# Patient Record
Sex: Female | Born: 1970 | Race: White | Hispanic: No | Marital: Married | Smoking: Former smoker
Health system: Southern US, Community
[De-identification: ages and names within clinical notes are randomized; demographics above are authoritative.]

## PROBLEM LIST (undated history)

## (undated) HISTORY — PX: CHOLECYSTECTOMY: SHX55

---

## 2009-11-10 ENCOUNTER — Ambulatory Visit (HOSPITAL_COMMUNITY): Admission: RE | Admit: 2009-11-10 | Discharge: 2009-11-10 | Payer: Self-pay | Admitting: Family Medicine

## 2012-01-08 ENCOUNTER — Other Ambulatory Visit (HOSPITAL_COMMUNITY): Payer: Self-pay | Admitting: Family Medicine

## 2012-01-08 ENCOUNTER — Ambulatory Visit (HOSPITAL_COMMUNITY)
Admission: RE | Admit: 2012-01-08 | Discharge: 2012-01-08 | Disposition: A | Discharge status: Home / Self Care | Payer: 59 | Source: Ambulatory Visit | Attending: Family Medicine | Admitting: Family Medicine

## 2012-01-08 DIAGNOSIS — R52 Pain, unspecified: Secondary | ICD-10-CM

## 2012-01-08 DIAGNOSIS — F17200 Nicotine dependence, unspecified, uncomplicated: Secondary | ICD-10-CM | POA: Insufficient documentation

## 2012-01-08 DIAGNOSIS — R079 Chest pain, unspecified: Secondary | ICD-10-CM | POA: Insufficient documentation

## 2012-01-23 ENCOUNTER — Ambulatory Visit (HOSPITAL_COMMUNITY)
Admission: RE | Admit: 2012-01-23 | Discharge: 2012-01-23 | Disposition: A | Discharge status: Home / Self Care | Payer: 59 | Source: Ambulatory Visit | Attending: Family Medicine | Admitting: Family Medicine

## 2012-01-23 DIAGNOSIS — E049 Nontoxic goiter, unspecified: Secondary | ICD-10-CM | POA: Insufficient documentation

## 2012-01-23 DIAGNOSIS — R52 Pain, unspecified: Secondary | ICD-10-CM

## 2012-03-04 ENCOUNTER — Other Ambulatory Visit (HOSPITAL_COMMUNITY): Payer: Self-pay | Admitting: Endocrinology

## 2012-03-04 DIAGNOSIS — E041 Nontoxic single thyroid nodule: Secondary | ICD-10-CM

## 2012-03-10 ENCOUNTER — Ambulatory Visit (HOSPITAL_COMMUNITY): Payer: 59

## 2012-03-17 ENCOUNTER — Other Ambulatory Visit (HOSPITAL_COMMUNITY): Payer: Self-pay | Admitting: Endocrinology

## 2012-03-17 ENCOUNTER — Ambulatory Visit (HOSPITAL_COMMUNITY)
Admission: RE | Admit: 2012-03-17 | Discharge: 2012-03-17 | Disposition: A | Discharge status: Home / Self Care | Payer: 59 | Source: Ambulatory Visit | Attending: Endocrinology | Admitting: Endocrinology

## 2012-03-17 VITALS — BP 109/73 | Resp 18

## 2012-03-17 DIAGNOSIS — E041 Nontoxic single thyroid nodule: Secondary | ICD-10-CM

## 2012-03-17 DIAGNOSIS — E042 Nontoxic multinodular goiter: Secondary | ICD-10-CM | POA: Insufficient documentation

## 2012-03-17 NOTE — Procedures (Signed)
PreOperative Dx: Multiple thyroid nodules Postoperative Dx: Multiple thyroid nodules Procedure:   US guided FNA of BILATERAL thyroid nodules Radiologist:  Tyron Russell Anesthesia:  1ml of 2% lidocaine Specimen:  FNA x 3 RIGHT nodule, FNA x 3 LEFT thyroid nodule EBL:   None Complications: None

## 2012-03-17 NOTE — Progress Notes (Signed)
Lidocaine 2%        1mL injected 

## 2012-03-27 ENCOUNTER — Other Ambulatory Visit (HOSPITAL_COMMUNITY): Payer: Self-pay | Admitting: Endocrinology

## 2012-03-27 DIAGNOSIS — E041 Nontoxic single thyroid nodule: Secondary | ICD-10-CM

## 2012-08-03 ENCOUNTER — Ambulatory Visit (HOSPITAL_COMMUNITY): Payer: 59

## 2012-09-03 ENCOUNTER — Other Ambulatory Visit (HOSPITAL_COMMUNITY): Payer: Self-pay | Admitting: Endocrinology

## 2012-09-03 DIAGNOSIS — E049 Nontoxic goiter, unspecified: Secondary | ICD-10-CM

## 2012-09-08 ENCOUNTER — Ambulatory Visit (HOSPITAL_COMMUNITY): Payer: 59

## 2012-09-10 ENCOUNTER — Ambulatory Visit (HOSPITAL_COMMUNITY)
Admission: RE | Admit: 2012-09-10 | Discharge: 2012-09-10 | Disposition: A | Discharge status: Home / Self Care | Payer: 59 | Source: Ambulatory Visit | Attending: Endocrinology | Admitting: Endocrinology

## 2012-09-10 DIAGNOSIS — E041 Nontoxic single thyroid nodule: Secondary | ICD-10-CM | POA: Insufficient documentation

## 2012-09-10 DIAGNOSIS — E049 Nontoxic goiter, unspecified: Secondary | ICD-10-CM

## 2013-06-15 ENCOUNTER — Other Ambulatory Visit (HOSPITAL_COMMUNITY): Payer: Self-pay | Admitting: Endocrinology

## 2013-06-15 DIAGNOSIS — E049 Nontoxic goiter, unspecified: Secondary | ICD-10-CM

## 2013-10-04 ENCOUNTER — Ambulatory Visit (HOSPITAL_COMMUNITY): Payer: 59

## 2013-10-08 ENCOUNTER — Ambulatory Visit (HOSPITAL_COMMUNITY)
Admission: RE | Admit: 2013-10-08 | Discharge: 2013-10-08 | Disposition: A | Discharge status: Home / Self Care | Payer: 59 | Source: Ambulatory Visit | Attending: Endocrinology | Admitting: Endocrinology

## 2013-10-08 DIAGNOSIS — E042 Nontoxic multinodular goiter: Secondary | ICD-10-CM | POA: Insufficient documentation

## 2013-10-08 DIAGNOSIS — E049 Nontoxic goiter, unspecified: Secondary | ICD-10-CM

## 2015-07-06 ENCOUNTER — Other Ambulatory Visit (HOSPITAL_COMMUNITY): Payer: Self-pay | Admitting: Family Medicine

## 2015-07-06 DIAGNOSIS — E049 Nontoxic goiter, unspecified: Secondary | ICD-10-CM

## 2015-07-12 ENCOUNTER — Ambulatory Visit (HOSPITAL_COMMUNITY)
Admission: RE | Admit: 2015-07-12 | Discharge: 2015-07-12 | Disposition: A | Discharge status: Home / Self Care | Payer: BLUE CROSS/BLUE SHIELD | Source: Ambulatory Visit | Attending: Family Medicine | Admitting: Family Medicine

## 2015-07-12 DIAGNOSIS — E049 Nontoxic goiter, unspecified: Secondary | ICD-10-CM

## 2015-07-12 DIAGNOSIS — E042 Nontoxic multinodular goiter: Secondary | ICD-10-CM | POA: Insufficient documentation

## 2015-10-11 ENCOUNTER — Encounter (HOSPITAL_COMMUNITY): Payer: Self-pay | Admitting: *Deleted

## 2015-10-11 ENCOUNTER — Emergency Department (HOSPITAL_COMMUNITY)
Admission: EM | Admit: 2015-10-11 | Discharge: 2015-10-12 | Disposition: A | Discharge status: Home / Self Care | Payer: BLUE CROSS/BLUE SHIELD | Attending: Emergency Medicine | Admitting: Emergency Medicine

## 2015-10-11 ENCOUNTER — Emergency Department (HOSPITAL_COMMUNITY): Payer: BLUE CROSS/BLUE SHIELD

## 2015-10-11 DIAGNOSIS — J9801 Acute bronchospasm: Secondary | ICD-10-CM | POA: Insufficient documentation

## 2015-10-11 DIAGNOSIS — R600 Localized edema: Secondary | ICD-10-CM | POA: Insufficient documentation

## 2015-10-11 DIAGNOSIS — R079 Chest pain, unspecified: Secondary | ICD-10-CM | POA: Diagnosis present

## 2015-10-11 DIAGNOSIS — Z87891 Personal history of nicotine dependence: Secondary | ICD-10-CM | POA: Diagnosis not present

## 2015-10-11 LAB — BASIC METABOLIC PANEL
ANION GAP: 8 (ref 5–15)
BUN: 14 mg/dL (ref 6–20)
CALCIUM: 8.9 mg/dL (ref 8.9–10.3)
CO2: 23 mmol/L (ref 22–32)
CREATININE: 0.96 mg/dL (ref 0.44–1.00)
Chloride: 106 mmol/L (ref 101–111)
Glucose, Bld: 113 mg/dL — ABNORMAL HIGH (ref 65–99)
Potassium: 3.7 mmol/L (ref 3.5–5.1)
SODIUM: 137 mmol/L (ref 135–145)

## 2015-10-11 LAB — CBC
HCT: 39.1 % (ref 36.0–46.0)
Hemoglobin: 13 g/dL (ref 12.0–15.0)
MCH: 28.4 pg (ref 26.0–34.0)
MCHC: 33.2 g/dL (ref 30.0–36.0)
MCV: 85.6 fL (ref 78.0–100.0)
PLATELETS: 356 10*3/uL (ref 150–400)
RBC: 4.57 MIL/uL (ref 3.87–5.11)
RDW: 13.2 % (ref 11.5–15.5)
WBC: 10.7 10*3/uL — AB (ref 4.0–10.5)

## 2015-10-11 LAB — TROPONIN I

## 2015-10-11 NOTE — ED Notes (Signed)
Pt c/o mid center chest pain that radiates to left side of chest area that started last night, pain is associated being lightheaded,

## 2015-10-12 LAB — TROPONIN I: Troponin I: <0.03 ng/mL (ref <0.031)

## 2015-10-12 LAB — D-DIMER, QUANTITATIVE: D-Dimer, Quant: 0.3 ug/mL-FEU (ref 0.00–0.50)

## 2015-10-12 MED ORDER — ALBUTEROL SULFATE HFA 108 (90 BASE) MCG/ACT IN AERS: 2.0000 | INHALATION_SPRAY | RESPIRATORY_TRACT | Status: AC | PRN

## 2015-10-12 MED ORDER — IPRATROPIUM-ALBUTEROL 0.5-2.5 (3) MG/3ML IN SOLN
3.0000 mL | Freq: Once | RESPIRATORY_TRACT | Status: AC
  Administered 2015-10-12: 3 mL via RESPIRATORY_TRACT
  Filled 2015-10-12: qty 3

## 2015-10-12 MED ORDER — PREDNISONE 50 MG PO TABS: 50.0000 mg | ORAL_TABLET | Freq: Every day | ORAL | Status: AC

## 2015-10-12 MED ORDER — PREDNISONE 50 MG PO TABS
60.0000 mg | ORAL_TABLET | Freq: Once | ORAL | Status: AC
  Administered 2015-10-12: 60 mg via ORAL
  Filled 2015-10-12: qty 1

## 2015-10-12 NOTE — ED Provider Notes (Signed)
CSN: [id number]     Arrival date & time 10/11/15  2251 History   First MD Initiated Contact with Patient 10/12/15 0005     Chief Complaint  Patient presents with  . Chest Pain     (Consider location/radiation/quality/duration/timing/severity/associated sxs/prior Treatment) Patient is a 45 y.o. female presenting with chest pain. The history is provided by the patient.  Chest Pain She has had a tight feeling in her chest since last night. There is associated dyspnea. She denies cough, fever, nausea, diaphoresis. Symptoms are slightly worse with exertion. She did take 2 aspirin at home and made an appointment with her PCP but got concerned and decided to come to the ED for evaluation. She's not had symptoms like this before. She is a nonsmoker without history of diabetes, hypertension, hyperlipidemia, and without family history of premature coronary atherosclerosis. She denies any recent surgery or long distance travel and is not on oral contraceptives.  History reviewed. No pertinent past medical history. Past Surgical History  Procedure Laterality Date  . Cholecystectomy     No family history on file. Social History  Substance Use Topics  . Smoking status: Former Games developer  . Smokeless tobacco: None  . Alcohol Use: No   OB History    No data available     Review of Systems  Cardiovascular: Positive for chest pain.  All other systems reviewed and are negative.     Allergies  Review of patient's allergies indicates no known allergies.  Home Medications   Prior to Admission medications   Not on File   BP 137/78 mmHg  Pulse 79  Temp(Src) 98.4 F (36.9 C) (Oral)  Resp 15  Ht 5' (1.524 m)  Wt 226 lb (102.513 kg)  BMI 44.14 kg/m2  SpO2 95%  LMP 09/24/2015 Physical Exam  Nursing note and vitals reviewed.  45 year old female, resting comfortably and in no acute distress. Vital signs are normal. Oxygen saturation is 95%, which is normal. Head is normocephalic and  atraumatic. PERRLA, EOMI. Oropharynx is clear. Neck is nontender and supple without adenopathy or JVD. Back is nontender and there is no CVA tenderness. Lungs are clear without rales, wheezes, or rhonchi. Slightly prolonged exhalation phase is noted. Chest is nontender. Heart has regular rate and rhythm without murmur. Abdomen is soft, flat, nontender without masses or hepatosplenomegaly and peristalsis is normoactive. Extremities have 1+ edema, full range of motion is present. Skin is warm and dry without rash. Neurologic: Mental status is normal, cranial nerves are intact, there are no motor or sensory deficits.  ED Course  Procedures (including critical care time) Labs Review Results for orders placed or performed during the hospital encounter of 10/11/15  Basic metabolic panel  Result Value Ref Range   Sodium 137 135 - 145 mmol/L   Potassium 3.7 3.5 - 5.1 mmol/L   Chloride 106 101 - 111 mmol/L   CO2 23 22 - 32 mmol/L   Glucose, Bld 113 (H) 65 - 99 mg/dL   BUN 14 6 - 20 mg/dL   Creatinine, Ser 8.75 0.44 - 1.00 mg/dL   Calcium 8.9 8.9 - 64.3 mg/dL   GFR calc non Af Amer >60 >60 mL/min   GFR calc Af Amer >60 >60 mL/min   Anion gap 8 5 - 15  CBC  Result Value Ref Range   WBC 10.7 (H) 4.0 - 10.5 K/uL   RBC 4.57 3.87 - 5.11 MIL/uL   Hemoglobin 13.0 12.0 - 15.0 g/dL   HCT 39.1  36.0 - 46.0 %   MCV 85.6 78.0 - 100.0 fL   MCH 28.4 26.0 - 34.0 pg   MCHC 33.2 30.0 - 36.0 g/dL   RDW 86.513.2 78.411.5 - 69.615.5 %   Platelets 356 150 - 400 K/uL  Troponin I  Result Value Ref Range   Troponin I <0.03 <0.031 ng/mL  D-dimer, quantitative  Result Value Ref Range   D-Dimer, Quant 0.30 0.00 - 0.50 ug/mL-FEU  Troponin I  Result Value Ref Range   Troponin I <0.03 <0.031 ng/mL   Imaging Review Dg Chest 2 View  10/12/2015  CLINICAL DATA:  Central chest pain radiating to the left chest, onset last night. EXAM: CHEST  2 VIEW COMPARISON:  01/08/2012 FINDINGS: The cardiomediastinal contours are normal.  Minimal right infrahilar subsegmental atelectasis. Pulmonary vasculature is normal. No consolidation, pleural effusion, or pneumothorax. No acute osseous abnormalities are seen. IMPRESSION: Minimal right infrahilar subsegmental atelectasis. Otherwise clear lungs. Electronically Signed   By: Rubye OaksMelanie  Ehinger M.D.   On: 10/12/2015 00:13   I have personally reviewed and evaluated these images and lab results as part of my medical decision-making.   EKG Interpretation   Date/Time:  Wednesday October 11 2015 23:00:59 EST Ventricular Rate:  81 PR Interval:  139 QRS Duration: 92 QT Interval:  398 QTC Calculation: 462 R Axis:   41 Text Interpretation:  Sinus rhythm Abnormal R-wave progression, early  transition No old tracing to compare Confirmed by [facility]GL[facility]  MD, Eleanora Guinyard ([id number])  on 10/11/2015 11:03:12 PM      MDM   Final diagnoses:  Bronchospasm    Chest discomfort of uncertain cause. With prolonged exhalation phase, I wonder if she may have some low-grade bronchospasm and will be given therapeutic trial of albuterol with ipratropium. With no other obvious cause, will also get d-dimer to screen for pulmonary embolism even though she does not have any risk factors for pulmonary embolism. Old records are reviewed and she has no relevant past visits.  D-dimer is come back negative. She had excellent relief of symptoms with albuterol and ipratropium. Apparently, all of her symptoms are related to bronchospasm. Repeat troponin was obtained and was also normal. She is discharged with prescriptions for prednisone and albuterol inhaler.  Dione Boozeavid Essa Malachi, MD 10/12/15 [phone]

## 2015-10-12 NOTE — ED Notes (Signed)
resp paged for breathing tx,  

## 2015-10-12 NOTE — Discharge Instructions (Signed)
Bronchospasm, Adult  A bronchospasm is a spasm or tightening of the airways going into the lungs. During a bronchospasm breathing becomes more difficult because the airways get smaller. When this happens there can be coughing, a whistling sound when breathing (wheezing), and difficulty breathing. Bronchospasm is often associated with asthma, but not all patients who experience a bronchospasm have asthma.  CAUSES   A bronchospasm is caused by inflammation or irritation of the airways. The inflammation or irritation may be triggered by:   · Allergies (such as to animals, pollen, food, or mold). Allergens that cause bronchospasm may cause wheezing immediately after exposure or many hours later.    · Infection. Viral infections are believed to be the most common cause of bronchospasm.    · Exercise.    · Irritants (such as pollution, cigarette smoke, strong odors, aerosol sprays, and paint fumes).    · Weather changes. Winds increase molds and pollens in the air. Rain refreshes the air by washing irritants out. Cold air may cause inflammation.    · Stress and emotional upset.    SIGNS AND SYMPTOMS   · Wheezing.    · Excessive nighttime coughing.    · Frequent or severe coughing with a simple cold.    · Chest tightness.    · Shortness of breath.    DIAGNOSIS   Bronchospasm is usually diagnosed through a history and physical exam. Tests, such as chest X-rays, are sometimes done to look for other conditions.  TREATMENT   · Inhaled medicines can be given to open up your airways and help you breathe. The medicines can be given using either an inhaler or a nebulizer machine.  · Corticosteroid medicines may be given for severe bronchospasm, usually when it is associated with asthma.  HOME CARE INSTRUCTIONS   · Always have a plan prepared for seeking medical care. Know when to call your health care provider and local emergency services (911 in the [place] Know where you can access local emergency care.  · Only take medicines as  directed by your health care provider.  · If you were prescribed an inhaler or nebulizer machine, ask your health care provider to explain how to use it correctly. Always use a spacer with your inhaler if you were given one.  · It is necessary to remain calm during an attack. Try to relax and breathe more slowly.   · Control your home environment in the following ways:      Change your heating and air conditioning filter at least once a month.      Limit your use of fireplaces and wood stoves.    Do not smoke and do not allow smoking in your home.      Avoid exposure to perfumes and fragrances.      Get rid of pests (such as roaches and mice) and their droppings.      Throw away plants if you see mold on them.      Keep your house clean and dust free.      Replace carpet with wood, tile, or vinyl flooring. Carpet can trap dander and dust.      Use allergy-proof pillows, mattress covers, and box spring covers.      Wash bed sheets and blankets every week in hot water and dry them in a dryer.      Use blankets that are made of polyester or cotton.      Wash hands frequently.  SEEK MEDICAL CARE IF:   · You have muscle aches.    · You have chest pain.    · The sputum changes from clear or   even after taking your prescribed medicines.   You have increased difficulty breathing.   You develop severe chest pain. MAKE SURE YOU:   Understand these instructions.  Will watch your condition.  Will get help right away if you are not doing well or get worse.   This information is not intended to replace advice given to you by your health care provider. Make sure you discuss any questions you have with your health care  provider.   Document Released: 09/19/2003 Document Revised: 10/07/2014 Document Reviewed: 03/08/2013 Elsevier Interactive Patient Education 2016 Else[employer] Inc.  Albuterol inhalation aerosol What is this medicine? ALBUTEROL (al Gaspar Bidding) is a bronchodilator. It helps open up the airways in your lungs to make it easier to breathe. This medicine is used to treat and to prevent bronchospasm. This medicine may be used for other purposes; ask your health care provider or pharmacist if you have questions. What should I tell my health care provider before I take this medicine? They need to know if you have any of the following conditions: -diabetes -heart disease or irregular heartbeat -high blood pressure -pheochromocytoma -seizures -thyroid disease -an unusual or allergic reaction to albuterol, levalbuterol, sulfites, other medicines, foods, dyes, or preservatives -pregnant or trying to get pregnant -breast-feeding How should I use this medicine? This medicine is for inhalation through the mouth. Follow the directions on your prescription label. Take your medicine at regular intervals. Do not use more often than directed. Make sure that you are using your inhaler correctly. Ask you doctor or health care provider if you have any questions. Talk to your pediatrician regarding the use of this medicine in children. Special care may be needed. Overdosage: If you think you have taken too much of this medicine contact a poison control center or emergency room at once. NOTE: This medicine is only for you. Do not share this medicine with others. What if I miss a dose? If you miss a dose, use it as soon as you can. If it is almost time for your next dose, use only that dose. Do not use double or extra doses. What may interact with this medicine? -anti-infectives like chloroquine and pentamidine -caffeine -cisapride -diuretics -medicines for colds -medicines for depression or for emotional or  psychotic conditions -medicines for weight loss including some herbal products -methadone -some antibiotics like clarithromycin, erythromycin, levofloxacin, and linezolid -some heart medicines -steroid hormones like dexamethasone, cortisone, hydrocortisone -theophylline -thyroid hormones This list may not describe all possible interactions. Give your health care provider a list of all the medicines, herbs, non-prescription drugs, or dietary supplements you use. Also tell them if you smoke, drink alcohol, or use illegal drugs. Some items may interact with your medicine. What should I watch for while using this medicine? Tell your doctor or health care professional if your symptoms do not improve. Do not use extra albuterol. If your asthma or bronchitis gets worse while you are using this medicine, call your doctor right away. If your mouth gets dry try chewing sugarless gum or sucking hard candy. Drink water as directed. What side effects may I notice from receiving this medicine? Side effects that you should report to your doctor or health care professional as soon as possible: -allergic reactions like skin rash, itching or hives, swelling of the face, lips, or tongue -breathing problems -chest pain -feeling faint or lightheaded, falls -high blood pressure -irregular heartbeat -fever -muscle cramps or weakness -pain, tingling, numbness in the hands or feet -vomiting Side effects  that usually do not require medical attention (report to your doctor or health care professional if they continue or are bothersome): -cough -difficulty sleeping -headache -nervousness or trembling -stomach upset -stuffy or runny nose -throat irritation -unusual taste This list may not describe all possible side effects. Call your doctor for medical advice about side effects. You may report side effects to FDA at 1-800-FDA-1088. Where should I keep my medicine? Keep out of the reach of children. Store at  room temperature between 15 and 30 degrees C (59 and 86 degrees F). The contents are under pressure and may burst when exposed to heat or flame. Do not freeze. This medicine does not work as well if it is too cold. Throw away any unused medicine after the expiration date. Inhalers need to be thrown away after the labeled number of puffs have been used or by the expiration date; whichever comes first. Ventolin HFA should be thrown away 12 months after removing from foil pouch. Check the instructions that come with your medicine. NOTE: This sheet is a summary. It may not cover all possible information. If you have questions about this medicine, talk to your doctor, pharmacist, or health care provider.    2016, Elsevier/Gold Standard. (2013-03-04 10:57:17)  Prednisone tablets What is this medicine? PREDNISONE (PRED ni sone) is a corticosteroid. It is commonly used to treat inflammation of the skin, joints, lungs, and other organs. Common conditions treated include asthma, allergies, and arthritis. It is also used for other conditions, such as blood disorders and diseases of the adrenal glands. This medicine may be used for other purposes; ask your health care provider or pharmacist if you have questions. What should I tell my health care provider before I take this medicine? They need to know if you have any of these conditions: -Cushing's syndrome -diabetes -glaucoma -heart disease -high blood pressure -infection (especially a virus infection such as chickenpox, cold sores, or herpes) -kidney disease -liver disease -mental illness -myasthenia gravis -osteoporosis -seizures -stomach or intestine problems -thyroid disease -an unusual or allergic reaction to lactose, prednisone, other medicines, foods, dyes, or preservatives -pregnant or trying to get pregnant -breast-feeding How should I use this medicine? Take this medicine by mouth with a glass of water. Follow the directions on the  prescription label. Take this medicine with food. If you are taking this medicine once a day, take it in the morning. Do not take more medicine than you are told to take. Do not suddenly stop taking your medicine because you may develop a severe reaction. Your doctor will tell you how much medicine to take. If your doctor wants you to stop the medicine, the dose may be slowly lowered over time to avoid any side effects. Talk to your pediatrician regarding the use of this medicine in children. Special care may be needed. Overdosage: If you think you have taken too much of this medicine contact a poison control center or emergency room at once. NOTE: This medicine is only for you. Do not share this medicine with others. What if I miss a dose? If you miss a dose, take it as soon as you can. If it is almost time for your next dose, talk to your doctor or health care professional. You may need to miss a dose or take an extra dose. Do not take double or extra doses without advice. What may interact with this medicine? Do not take this medicine with any of the following medications: -metyrapone -mifepristone This medicine may also  interact with the following medications: -aminoglutethimide -amphotericin B -aspirin and aspirin-like medicines -barbiturates -certain medicines for diabetes, like glipizide or glyburide -cholestyramine -cholinesterase inhibitors -cyclosporine -digoxin -diuretics -ephedrine -female hormones, like estrogens and birth control pills -isoniazid -ketoconazole -NSAIDS, medicines for pain and inflammation, like ibuprofen or naproxen -phenytoin -rifampin -toxoids -vaccines -warfarin This list may not describe all possible interactions. Give your health care provider a list of all the medicines, herbs, non-prescription drugs, or dietary supplements you use. Also tell them if you smoke, drink alcohol, or use illegal drugs. Some items may interact with your medicine. What  should I watch for while using this medicine? Visit your doctor or health care professional for regular checks on your progress. If you are taking this medicine over a prolonged period, carry an identification card with your name and address, the type and dose of your medicine, and your doctor's name and address. This medicine may increase your risk of getting an infection. Tell your doctor or health care professional if you are around anyone with measles or chickenpox, or if you develop sores or blisters that do not heal properly. If you are going to have surgery, tell your doctor or health care professional that you have taken this medicine within the last twelve months. Ask your doctor or health care professional about your diet. You may need to lower the amount of salt you eat. This medicine may affect blood sugar levels. If you have diabetes, check with your doctor or health care professional before you change your diet or the dose of your diabetic medicine. What side effects may I notice from receiving this medicine? Side effects that you should report to your doctor or health care professional as soon as possible: -allergic reactions like skin rash, itching or hives, swelling of the face, lips, or tongue -changes in emotions or moods -changes in vision -depressed mood -eye pain -fever or chills, cough, sore throat, pain or difficulty passing urine -increased thirst -swelling of ankles, feet Side effects that usually do not require medical attention (report to your doctor or health care professional if they continue or are bothersome): -confusion, excitement, restlessness -headache -nausea, vomiting -skin problems, acne, thin and shiny skin -trouble sleeping -weight gain This list may not describe all possible side effects. Call your doctor for medical advice about side effects. You may report side effects to FDA at 1-800-FDA-1088. Where should I keep my medicine? Keep out of the reach  of children. Store at room temperature between 15 and 30 degrees C (59 and 86 degrees F). Protect from light. Keep container tightly closed. Throw away any unused medicine after the expiration date. NOTE: This sheet is a summary. It may not cover all possible information. If you have questions about this medicine, talk to your doctor, pharmacist, or health care provider.    2016, Elsevier/Gold Standard. (2011-05-02 10:57:14)

## 2016-07-30 ENCOUNTER — Other Ambulatory Visit (HOSPITAL_COMMUNITY): Payer: Self-pay | Admitting: Family Medicine

## 2016-07-30 DIAGNOSIS — E042 Nontoxic multinodular goiter: Secondary | ICD-10-CM

## 2016-08-02 ENCOUNTER — Ambulatory Visit (HOSPITAL_COMMUNITY)
Admission: RE | Admit: 2016-08-02 | Discharge: 2016-08-02 | Disposition: A | Discharge status: Home / Self Care | Payer: BLUE CROSS/BLUE SHIELD | Source: Ambulatory Visit | Attending: Family Medicine | Admitting: Family Medicine

## 2016-08-02 DIAGNOSIS — E042 Nontoxic multinodular goiter: Secondary | ICD-10-CM

## 2017-05-14 IMAGING — US US THYROID
1 of 2 series · 12 of 25 positions shown · non-contrast
Comparison: 07/12/2015

CLINICAL DATA: Multi nodular goiter follow-up.

EXAM:
THYROID ULTRASOUND
TECHNIQUE: Ultrasound examination of the thyroid gland and adjacent soft
tissues was performed.

[Series 1: us thyroid · 0.06mm/px · 12 of 65 slices shown]
[im 3/65]
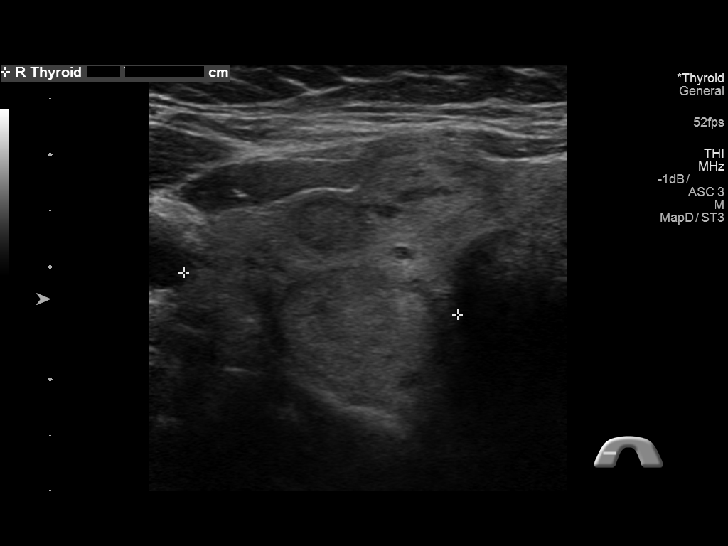
[im 9/65]
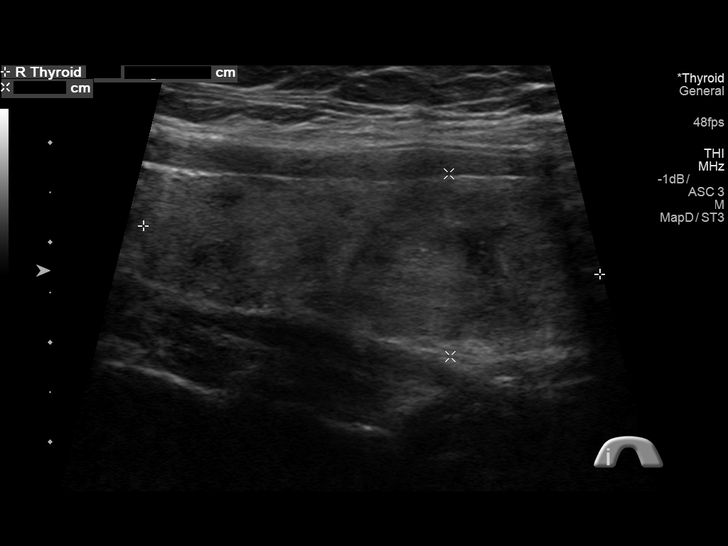
[im 14/65]
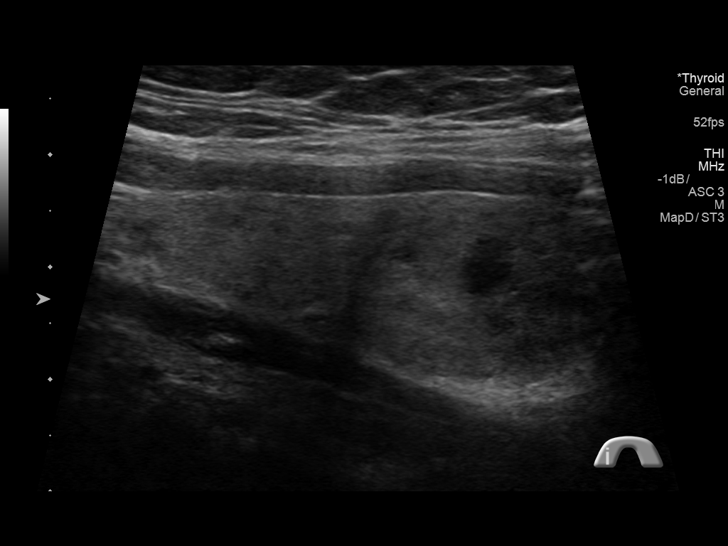
[im 20/65]
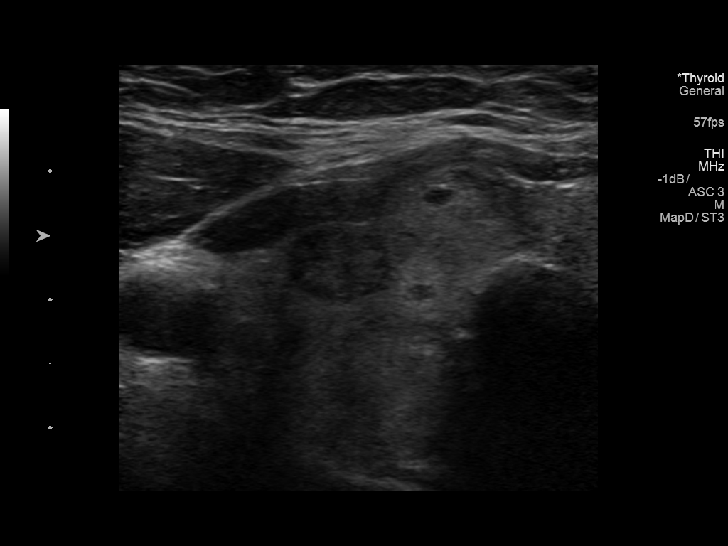
[im 26/65]
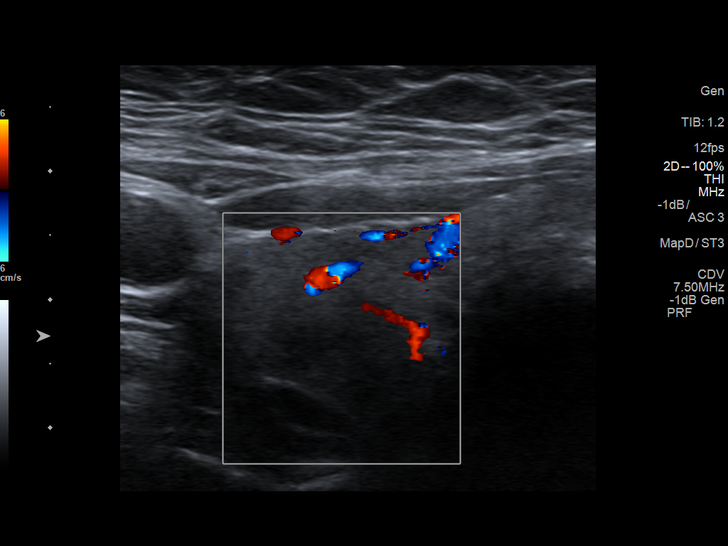
[im 31/65]
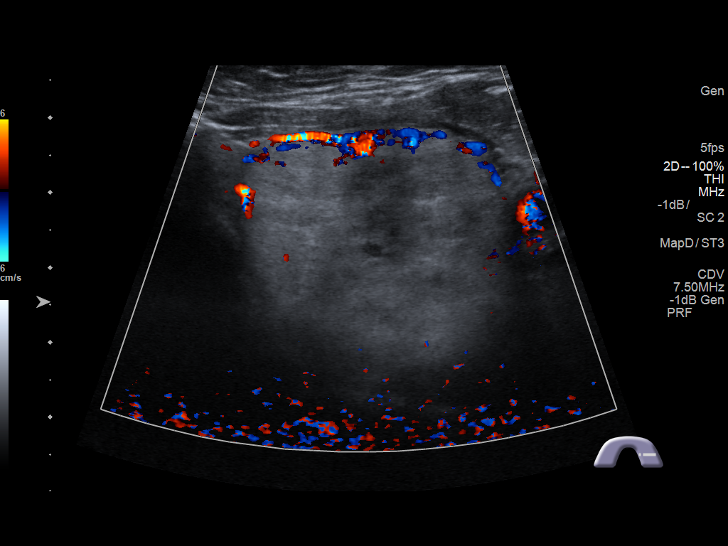
[im 37/65]
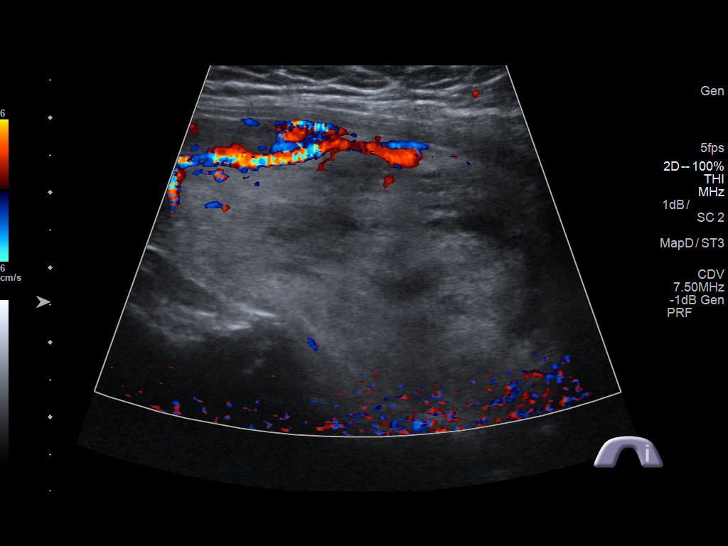
[im 42/65]
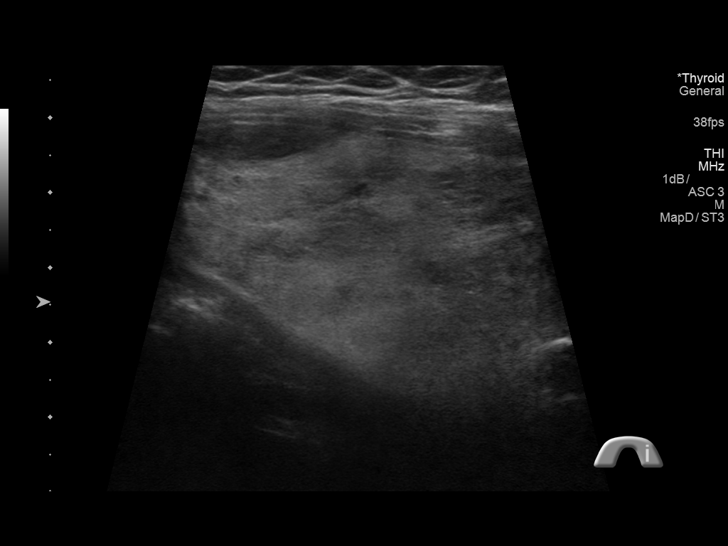
[im 48/65]
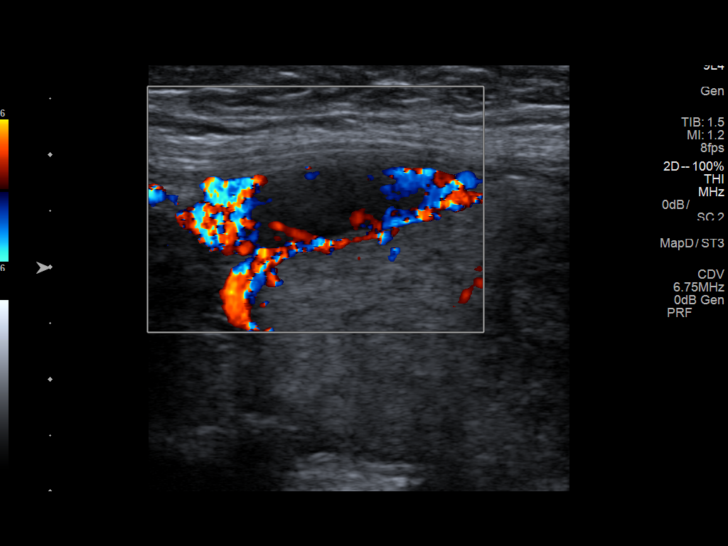
[im 53/65]
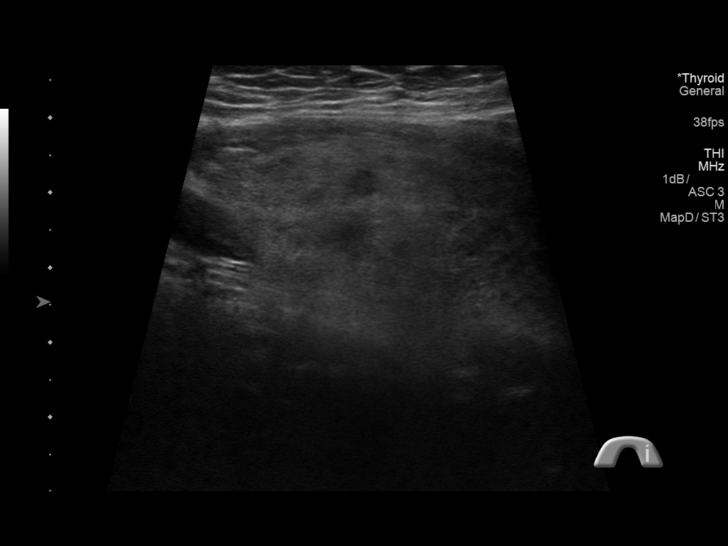
[im 59/65]
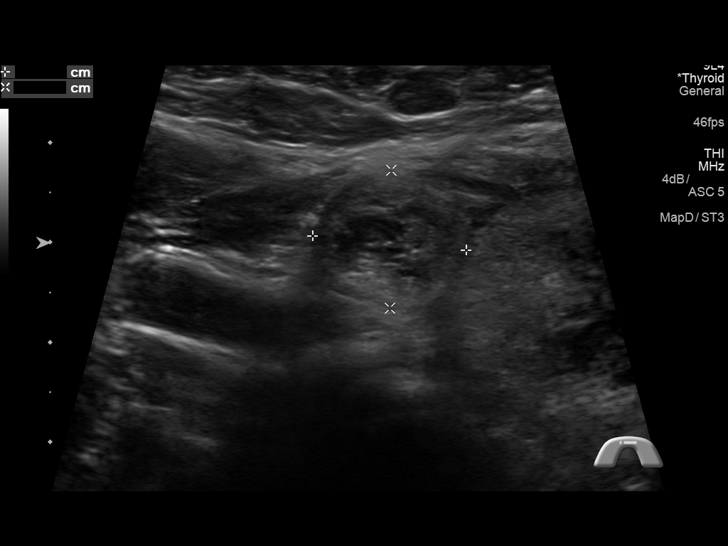
[im 65/65]
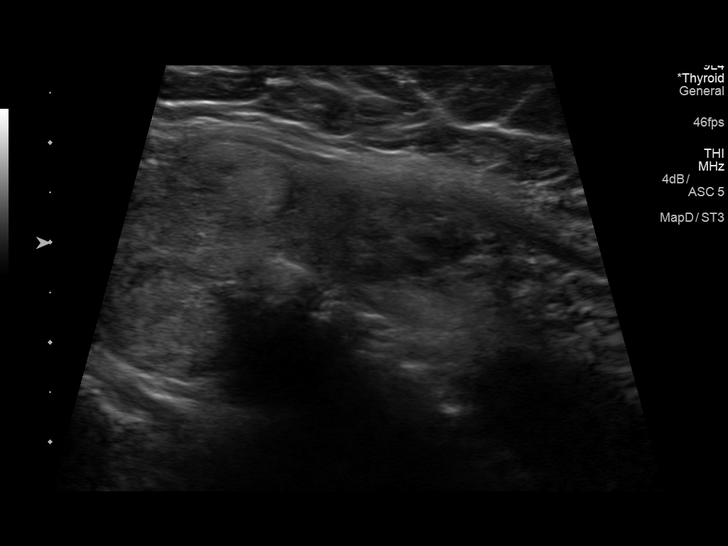

[12 of 25 positions shown; findings below may reference images not displayed]

FINDINGS: Parenchymal Echotexture: Mildly heterogenous

Estimated total number of nodules >/= 1 cm: 4

Number of spongiform nodules >/=  2 cm not described below (TR1): 0

Number of mixed cystic and solid nodules >/= 1.5 cm not described
below (TR2): 0

_________________________________________________________

Isthmus:  Measures 1.1 cm in thickness.

Nodule # 1:

Location: Isthmus; Mid

Size: Measures 1.8 x 1.4 x 1.5 cm.

Composition: solid/almost completely solid (2)

Echogenicity: hypoechoic (2).

Shape: not taller-than-wide (0)

Margins: ill-defined (0)

Echogenic foci: none (0)

ACR TI-RADS total points: 4.

ACR TI-RADS risk category: TR4 (4-6 points).

ACR TI-RADS recommendations:

**Given size (>/= 1.5 cm) and appearance, fine needle aspiration of
this moderately suspicious nodule should be considered based on
TI-RADS criteria.

There is an isoechoic nodule along the right side of the isthmus
which roughly measures up to 1.1 cm previously measured 1.2 cm.
Characteristics of this nodule have not significantly changed.

_________________________________________________________

Right lobe: 4.6 x 1.8 x 2.5 cm and previously measured 4.8 x 1.8 x
1.9 cm.

Previously biopsied isoechoic nodule in the inferior right thyroid
lobe measures 2.3 x 1.5 x 1.7 cm and previously measured 2.2 x 1.3 x
1.6 cm.

Nodule # 2:

Prior biopsy: No

Location: Right; Inferior

Size: 0.9 x 0.6 x 0.9 cm previously measured roughly 1 cm.

Composition: solid/almost completely solid (2)

Echogenicity: hypoechoic (2)

Shape: not taller-than-wide (0)

Margins: ill-defined (0)

Echogenic foci: none (0)

ACR TI-RADS total points: 4.

ACR TI-RADS risk category: TR4 (4-6 points).

Change in features: No

Change in ACR TI-RADS risk category: No

ACR TI-RADS recommendations:

Given size (<0.9 cm) and appearance, this nodule does NOT meet
TI-RADS criteria for biopsy or dedicated follow-up.

_________________________________________________________

Left lobe: Measures 5.5 x 3.5 x 3.7 cm and previously measured 5.5 x
3.2 x 3.9 cm.

Again noted is a dominant isoechoic nodule occupying a large portion
of left thyroid lobe. This nodule roughly measures 4.4 x 3.7 x
cm and previously measured 5.1 x 2.8 x 3.6 cm. This nodule was
biopsied in 9462.

Nodule # 3:

Location: Left; Superior

Size: 2.3 x 0.8 x 1.8 cm. In retrospect, this area was present on
the previous study and measured roughly 1.6 x 0.5 cm.

Composition: solid/almost completely solid (2)

Echogenicity: very hypoechoic (3)

Shape: not taller-than-wide (0)

Margins: smooth (0)

Echogenic foci: none (0)

ACR TI-RADS total points: 5.

ACR TI-RADS risk category: TR4 (4-6 points).

ACR TI-RADS recommendations:

**Given size (>/= 1.5 cm) and appearance, fine needle aspiration of
this moderately suspicious nodule should be considered based on
TI-RADS criteria.
IMPRESSION: Multi nodular goiter.

Complex case due to the multiple nodules and prior biopsies. No
significant change in the previously biopsied bilateral dominant
nodules.

There appears to be a new isthmus nodule measuring up to 1.8 cm and
recommend biopsy of this nodule.

There is enlargement of a hypoechoic area/nodule along the superior
left thyroid lobe. Recommend biopsy of this hypoechoic area/nodule.

The above is in keeping with the ACR TI-RADS recommendations - [HOSPITAL] 3668;[DATE].

## 2018-08-14 ENCOUNTER — Other Ambulatory Visit (HOSPITAL_COMMUNITY): Payer: Self-pay | Admitting: Family Medicine

## 2018-08-14 DIAGNOSIS — E041 Nontoxic single thyroid nodule: Secondary | ICD-10-CM

## 2018-08-18 ENCOUNTER — Encounter (HOSPITAL_COMMUNITY): Payer: Self-pay

## 2018-08-18 ENCOUNTER — Ambulatory Visit (HOSPITAL_COMMUNITY): Payer: BLUE CROSS/BLUE SHIELD

## 2018-08-31 ENCOUNTER — Ambulatory Visit (HOSPITAL_COMMUNITY)
Admission: RE | Admit: 2018-08-31 | Discharge: 2018-08-31 | Disposition: A | Discharge status: Home / Self Care | Payer: BLUE CROSS/BLUE SHIELD | Source: Ambulatory Visit | Attending: Family Medicine | Admitting: Family Medicine

## 2018-08-31 DIAGNOSIS — E041 Nontoxic single thyroid nodule: Secondary | ICD-10-CM | POA: Insufficient documentation

## 2019-06-12 IMAGING — US US THYROID
1 series · 12 of 25 positions shown · non-contrast
Comparison: 08/02/2016; 01/23/2012; bilateral thyroid nodule
fine-needle aspiration-03/17/2012

CLINICAL DATA: Prior ultrasound follow-up. History of multinodular
goiter. History of bilateral thyroid nodule fine-needle aspiration
in 03/17/2012

EXAM:
THYROID ULTRASOUND
TECHNIQUE: Ultrasound examination of the thyroid gland and adjacent soft
tissues was performed.

[Series 1: us thyroid · 12 of 48 slices shown]
[im 2/48]
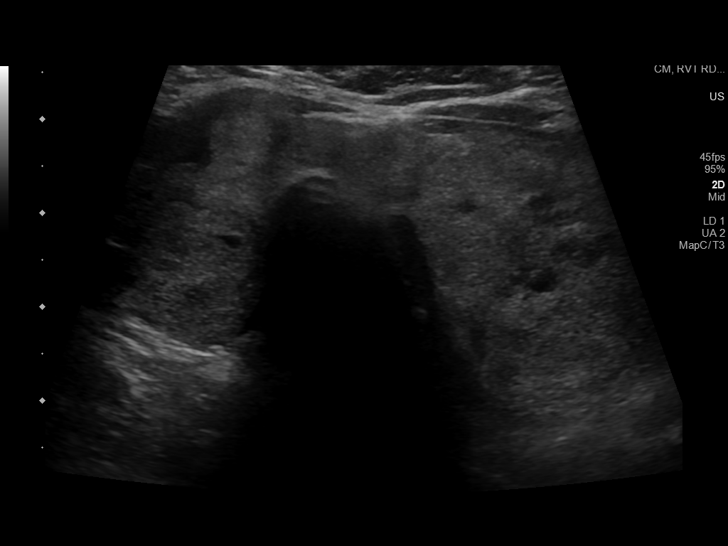
[im 6/48]
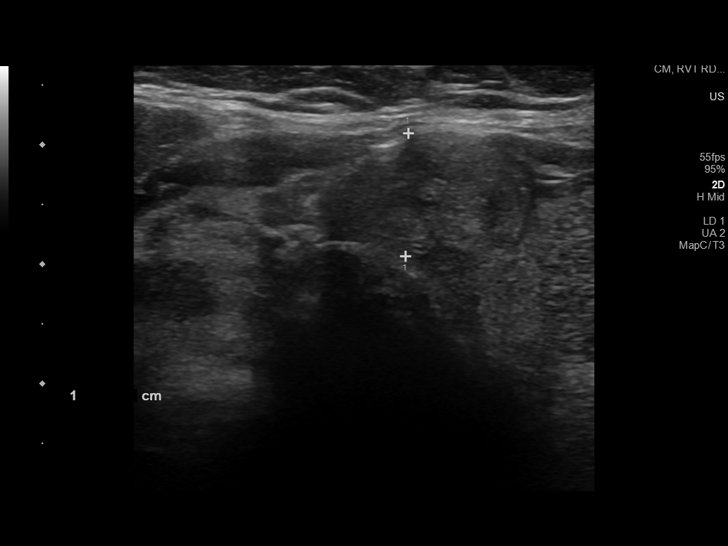
[im 10/48]
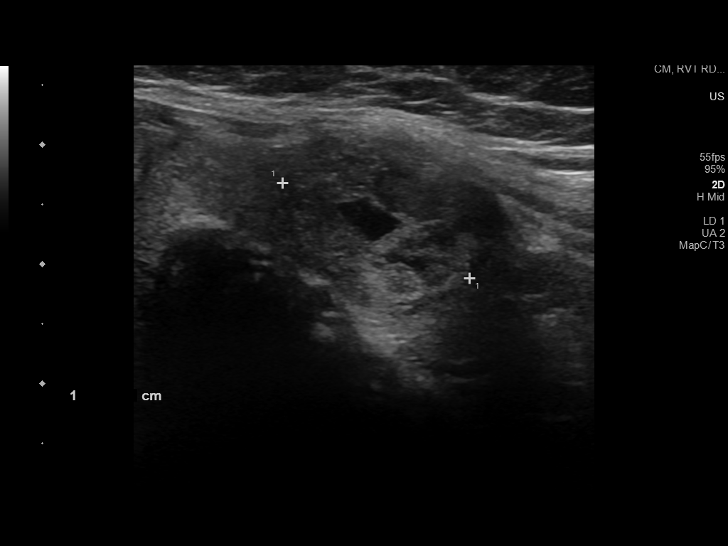
[im 14/48]
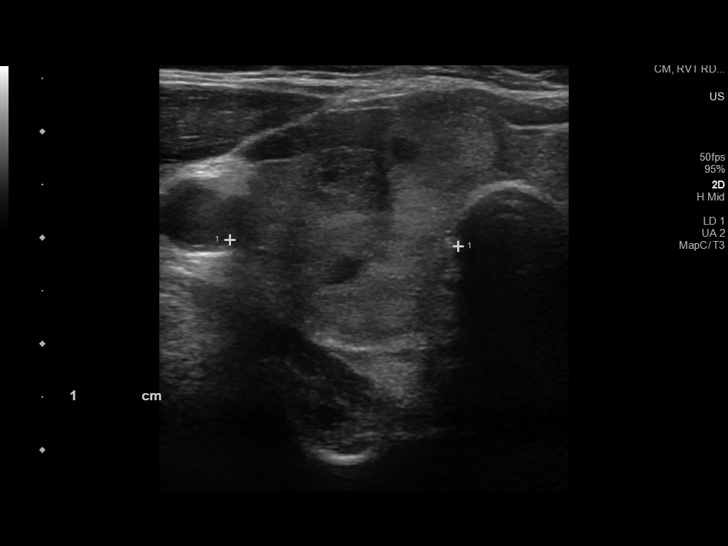
[im 18/48]
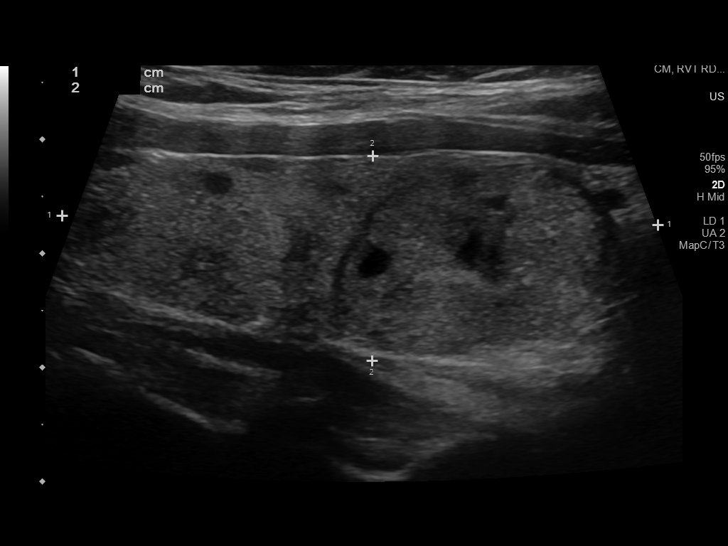
[im 22/48]
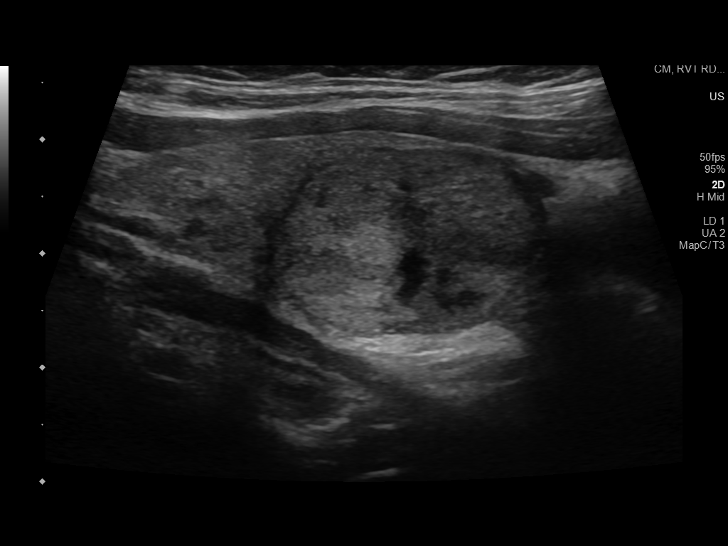
[im 26/48]
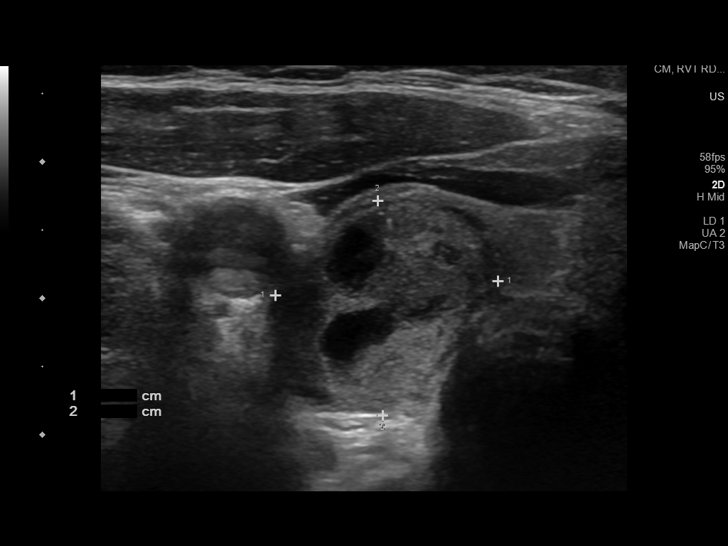
[im 30/48]
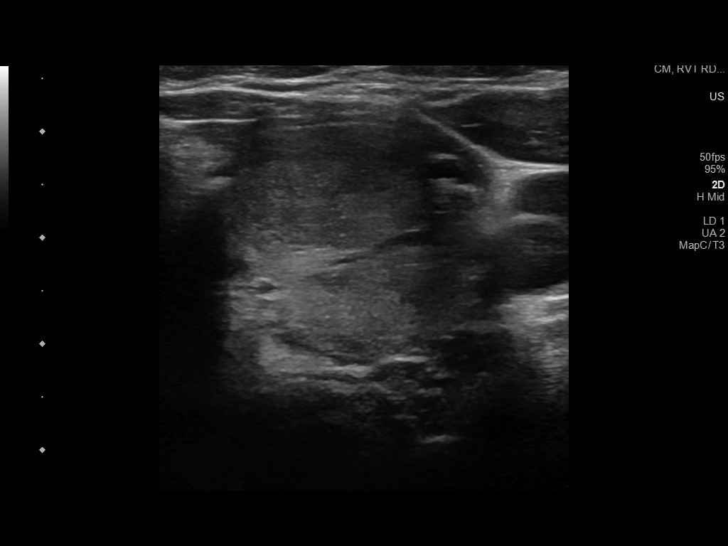
[im 34/48]
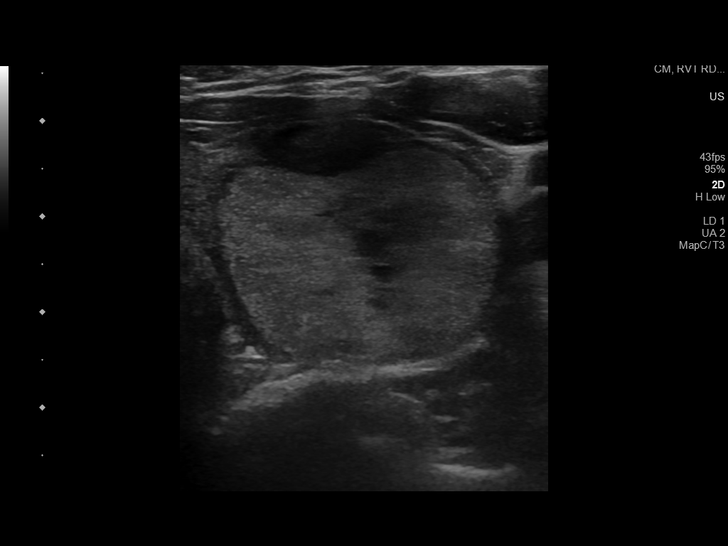
[im 38/48]
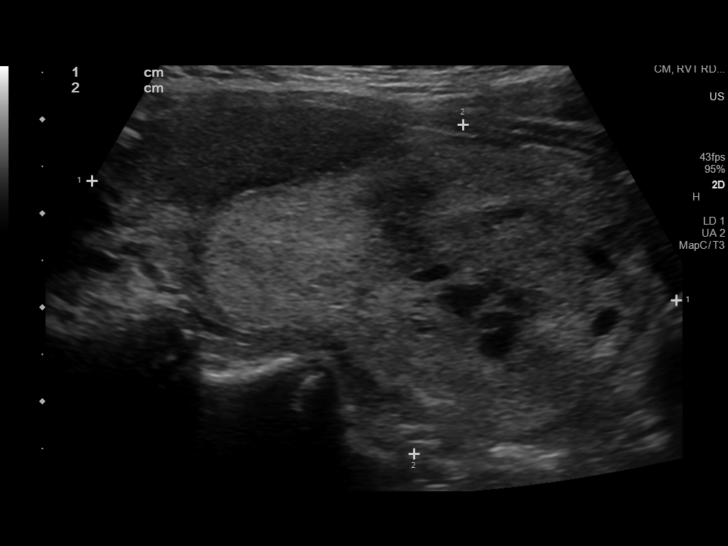
[im 42/48]
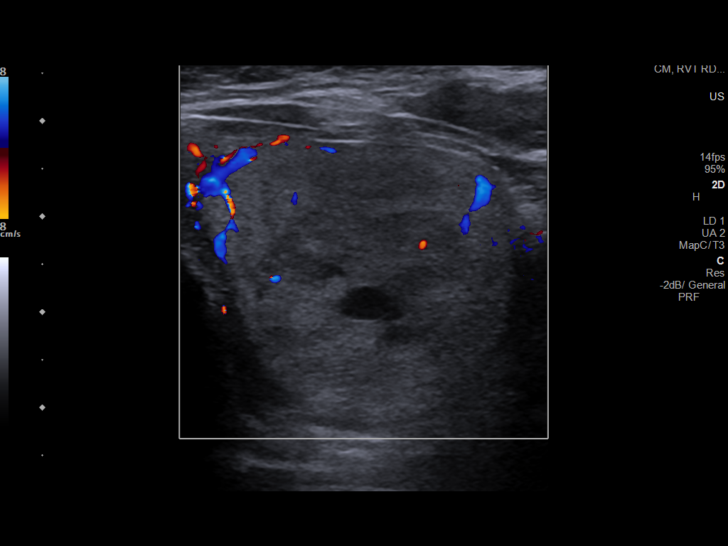
[im 46/48]
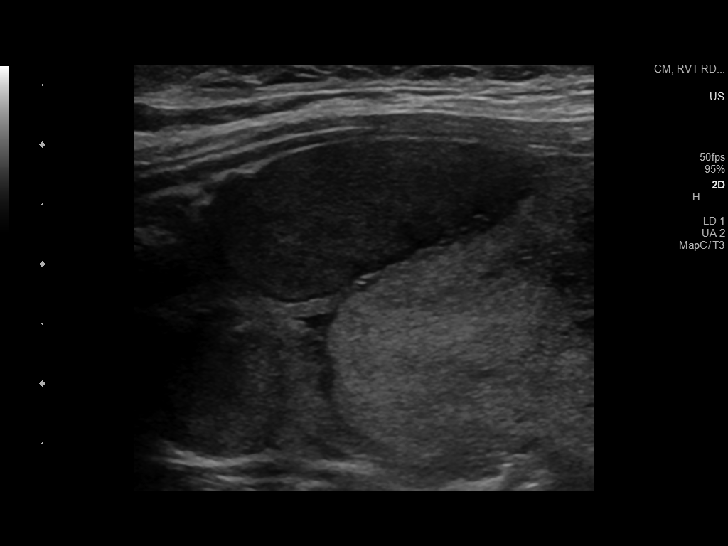

[12 of 25 positions shown; findings below may reference images not displayed]

FINDINGS: Parenchymal Echotexture: Mildly heterogenous

Isthmus: Enlarged measuring 1 cm in diameter, unchanged, previously,
1.1 cm.

Right lobe: Borderline enlarged measuring 5.2 x 1.8 x 2.2 cm,
unchanged, previously, 4.6 x 1.8 x 2.5 cm.

Left lobe: Enlarged measuring 6.4 x 3.5 x 3.3 cm, unchanged,
previously, 5.5 x 3.5 x 3.7 cm with slight differences likely total
to scan plane projection.

_________________________________________________________

Estimated total number of nodules >/= 1 cm: 4

Number of spongiform nodules >/=  2 cm not described below (TR1): 0

Number of mixed cystic and solid nodules >/= 1.5 cm not described
below (TR2): 0

_________________________________________________________

The approximately 1.8 x 1.6 x 1.5 cm nodule within the left side of
the thyroid isthmus (labeled 1) is unchanged compared to the [DATE]
examination, previously, 1.8 x 1.5 x 1.4 cm though has minimally
increased in size compared to the [DATE] examination, previously,
1.2 x 1.1 x 0.5 cm though size differences are attributable to
interval partial cystic degeneration, a typically benign finding.
etiology.

The previously biopsied approximately 2.4 x 1.6 x 1.6 cm nodule
within the inferior pole the right lobe of the thyroid (labeled 2)
appears similar to the [DATE] examination, previously, 2.0 x 1.8 x
1.2 cm, again with slight size differences likely attributable to
interval partial cystic degeneration, a typically benign finding.
Correlation with prior biopsy results is recommended.

The previously biopsied approximately 5.0 x 3.4 x 3.1 cm nodule/mass
replacing the mid and inferior pole of the left lobe of the thyroid
(labeled 3) appears similar to the [DATE] examination, previously,
4.8 x 3.3 x 2.8 cm, with slight differences likely total to interval
partial cystic degeneration. Correlation with prior biopsy results
is recommended.

_________________________________________________________

Nodule # 4:

Prior biopsy: No

Location: Left; Superior

Maximum size: 2.5 cm; Other 2 dimensions: 2.2 x 1.3 cm, previously,
2.3 x 1.8 x 0.8 cm (when compared to the [DATE] examination, not
definitely seen preceding examinations).

Composition: solid/almost completely solid (2)

Echogenicity: hypoechoic (2)

Shape: not taller-than-wide (0)

Margins: smooth (0)

Echogenic foci: none (0)

ACR TI-RADS total points: 4.

ACR TI-RADS risk category:  TR4 (4-6 points).

Significant change in size (>/= 20% in two dimensions and minimal
increase of 2 mm): Yes

Change in features: No

Change in ACR TI-RADS risk category: No

ACR TI-RADS recommendations:

**Given size (>/= 1.5 cm) and appearance, fine needle aspiration of
this moderately suspicious nodule should be considered based on
TI-RADS criteria.

_________________________________________________________
IMPRESSION: 1. Findings suggestive of multinodular goiter.
2. Dominant nodules within the right and left lobes of the thyroid
appears similar to the [DATE] examination with slight size
differences attributable to interval partial cystic degeneration, a
typically benign finding. Correlation with prior biopsy results is
recommended. Assuming a benign pathologic diagnosis, repeat sampling
and/or continued dedicated follow-up is not recommended.
3. Nodule #1 within the thyroid isthmus appears similar to the
[DATE] examination (previously felt to represent a new nodule) with
slight size differences attributable to partial cystic degeneration
and as such does not meet imaging criteria to recommend percutaneous
sampling or continued dedicated follow-up.
4. Nodule #4 within the left lobe of the thyroid has minimally
increased in size compared to the [DATE] examination was not
definitely seen on preceding examinations. As such, this nodule
meets imaging criteria to recommend percutaneous sampling as
clinically indicated.

The above is in keeping with the ACR TI-RADS recommendations - [HOSPITAL] 3952;[DATE].

## 2021-06-20 ENCOUNTER — Ambulatory Visit (INDEPENDENT_AMBULATORY_CARE_PROVIDER_SITE_OTHER): Payer: BC Managed Care – PPO | Admitting: Behavioral Health

## 2021-06-20 ENCOUNTER — Other Ambulatory Visit: Payer: Self-pay

## 2021-06-20 ENCOUNTER — Encounter: Payer: Self-pay | Admitting: Behavioral Health

## 2021-06-20 VITALS — BP 146/94 | HR 84 | Ht 61.0 in | Wt 184.0 lb

## 2021-06-20 DIAGNOSIS — F4310 Post-traumatic stress disorder, unspecified: Secondary | ICD-10-CM

## 2021-06-20 DIAGNOSIS — F411 Generalized anxiety disorder: Secondary | ICD-10-CM | POA: Diagnosis not present

## 2021-06-20 DIAGNOSIS — F330 Major depressive disorder, recurrent, mild: Secondary | ICD-10-CM | POA: Diagnosis not present

## 2021-06-20 NOTE — Progress Notes (Signed)
[facility] MD/PA/NP Initial Note  06/20/2021 12:51 PM Rose West  MRN:  [id number]  Chief Complaint:  Chief Complaint   Anxiety; Depression; Trauma; Family Problem; Establish Care     HPI:   50 year old female presents to this office for initial visit and to establish care. She says that she has struggled with some anxiety and depression since her early 37's. She did suffer from post partum depression after the birth of her second daughter 16 years ago. She was briefly on antidepressant but does not remember which one. Says that she has "always powered through" and does not really like taking medication. Says that she found out her husband was addicted to pornography around 2008-2010  and they went 2.5 years without being intimate. Says that in 2019, she discovered that he found a 52 year old "Sugar Baby" online and was having an ongoing affair. Says she found out he spent nearly $150,000 dollars on this girl out of their small business account. Says he has since broken it off but she has had extensive counseling in trying to cope with the affair and broken trust. Says she decided to stay in the marriage and attempt to work it out. She says that she has improved overall but has moments where certain things trigger severe anxiety. She said that she was on trip with her daughter and saw a [employer] transaction on her phone and it immediately caused her anxiety.  She is currently on Klonopin which she takes occasionally. Says she does not want to get hooked on them. She has started trial of SAM-E trying the homeopathic approach. She would like to continue the Klonopin but is undecided about restarting an AD. She reports anxiety today 4/10 and depression 3/10/ she is sleeping 7-8 hours per night but wakes up occasionally. She would like to come back in 4 weeks to reevaluate and consider possible options. No mania, no psychosis. No SI/HI  Past psychiatric medications: Klonopin       Visit Diagnosis:     ICD-10-CM   1. Generalized anxiety disorder  F41.1     2. PTSD (post-traumatic stress disorder)  F43.10     3. Mild episode of recurrent major depressive disorder (HCC)  F33.0       Past Psychiatric History: anxiety, depression  Past Medical History: History reviewed. No pertinent past medical history.  Past Surgical History:  Procedure Laterality Date   CHOLECYSTECTOMY      Family Psychiatric History: none noted this visit  Family History: History reviewed. No pertinent family history.  Social History:  Social History   Socioeconomic History   Marital status: Married    Spouse name: Not on file   Number of children: 2   Years of education: Not on file   Highest education level: Not on file  Occupational History   Not on file  Tobacco Use   Smoking status: Former   Smokeless tobacco: Never  Vaping Use   Vaping Use: Never used  Substance and Sexual Activity   Alcohol use: Yes    Comment: occcasionally one drink or two   Drug use: No   Sexual activity: Not Currently  Other Topics Concern   Not on file  Social History Narrative   Lives in [place] with husband and one 41 year old daughter.     Social Determinants of Health   Financial Resource Strain: Not on file  Food Insecurity: Not on file  Transportation Needs: Not on file  Physical Activity:  Not on file  Stress: Not on file  Social Connections: Not on file    Allergies: No Known Allergies  Metabolic Disorder Labs: No results found for: HGBA1C, MPG No results found for: PROLACTIN No results found for: CHOL, TRIG, HDL, CHOLHDL, VLDL, LDLCALC No results found for: TSH  Therapeutic Level Labs: No results found for: LITHIUM No results found for: VALPROATE No components found for:  CBMZ  Current Medications: Current Outpatient Medications  Medication Sig Dispense Refill   albuterol (PROVENTIL HFA;VENTOLIN HFA) 108 (90 Base) MCG/ACT inhaler Inhale 2 puffs into the lungs every 4 (four) hours as  needed for wheezing or shortness of breath (or coughing or chest tightness). 1 Inhaler 0   predniSONE (DELTASONE) 50 MG tablet Take 1 tablet (50 mg total) by mouth daily. 5 tablet 0   No current facility-administered medications for this visit.    Medication Side Effects: none  Orders placed this visit:  No orders of the defined types were placed in this encounter.   Psychiatric Specialty Exam:  Review of Systems  Constitutional: Negative.   Musculoskeletal:  Positive for joint swelling.  Allergic/Immunologic: Negative.   Neurological: Negative.   Psychiatric/Behavioral:  Positive for dysphoric mood and sleep disturbance. The patient is nervous/anxious.    Blood pressure (!) 146/94, pulse 84, height 5\' 1"  (1.549 m), weight 184 lb (83.5 kg).Body mass index is 34.77 kg/m.  General Appearance: Casual, Neat, and Well Groomed  Eye Contact:  Good  Speech:  Clear and Coherent  Volume:  Normal  Mood:  Anxious and Depressed  Affect:  Depressed and Anxious  Thought Process:  Coherent  Orientation:  Full (Time, Place, and Person)  Thought Content: Logical   Suicidal Thoughts:  No  Homicidal Thoughts:  No  Memory:  WNL  Judgement:  Good  Insight:  Good  Psychomotor Activity:  Normal  Concentration:  Concentration: Good  Recall:  Good  Fund of Knowledge: Good  Language: Good  Assets:  Desire for Improvement  ADL's:  Intact  Cognition: WNL  Prognosis:  Good   Screenings:  GAD-7    Flowsheet Row Office Visit from 06/20/2021 in [facility]  Total GAD-7 Score 14      PHQ2-9    Flowsheet Row Office Visit from 06/20/2021 in [facility]  PHQ-2 Total Score 2  PHQ-9 Total Score 9       Receiving Psychotherapy: no  Recommendations/Plan  Greater than 50% of 60 min  face to face time with patient was spent on counseling and coordination of care. We discussed long history of anxiety and depression. Discussed past trauma in marriage and family  dynamics. Reviewed tx hx.     Treatment Plan/Recommendations:  Continue Klonopin 0.5 mg daily Patient wants to try SAM-E for a few weeks to see if effective Will follow up in 4 weeks to reassess Provided emergency contact information  Reviewed PDMP      06/22/2021, NP

## 2021-07-17 ENCOUNTER — Ambulatory Visit: Payer: BC Managed Care – PPO | Admitting: Behavioral Health

## 2021-07-19 ENCOUNTER — Ambulatory Visit: Payer: BC Managed Care – PPO | Admitting: Behavioral Health

## 2021-07-19 ENCOUNTER — Other Ambulatory Visit: Payer: Self-pay

## 2021-07-19 ENCOUNTER — Encounter: Payer: Self-pay | Admitting: Behavioral Health

## 2021-07-19 DIAGNOSIS — F330 Major depressive disorder, recurrent, mild: Secondary | ICD-10-CM

## 2021-07-19 DIAGNOSIS — F411 Generalized anxiety disorder: Secondary | ICD-10-CM

## 2021-07-19 DIAGNOSIS — F4310 Post-traumatic stress disorder, unspecified: Secondary | ICD-10-CM

## 2021-07-19 MED ORDER — CLONAZEPAM 0.5 MG PO TABS: 0.5000 mg | ORAL_TABLET | Freq: Every day | ORAL | 2 refills | Status: AC

## 2021-07-19 MED ORDER — BUPROPION HCL ER (XL) 150 MG PO TB24: 150.0000 mg | ORAL_TABLET | Freq: Every day | ORAL | 2 refills | Status: DC

## 2021-07-19 NOTE — Progress Notes (Signed)
[facility] Med Check  Patient ID: Rose West,  MRN: [id number]  PCP: Richardean Chimera, MD  Date of Evaluation: 07/19/2021 Time spent:30 minutes  Chief Complaint:   HISTORY/CURRENT STATUS: HPI  50 year old female presents to this office for follow up and medication management. She says that she has been doing very well. She says that she started a trial of SAM-E and that she feel like it has been helping her with moods and feeling somewhat better. However, she says that she usually starts getting seasonal depression that occurs every year and would like to try Wellbutrin. Says she does not want to take any regular AD for long periods at this time. She says that her home life has improved and that she and her husband have been doing better. Says he is now involved in ministering to other men that have experienced infidelity and sexual addiction. Says that she is now getting calls from the wives of these mens seeking counsel. She reports her anxiety today at 2/10 and depression at 2/10. She is sleeping 7-8 hours per night. No mania, no psychosis, no SI/HI.   Past psychiatric medications: Klonopin   Individual Medical History/ Review of Systems: Changes? :No   Allergies: Patient has no known allergies.  Current Medications:  Current Outpatient Medications:    albuterol (PROVENTIL HFA;VENTOLIN HFA) 108 (90 Base) MCG/ACT inhaler, Inhale 2 puffs into the lungs every 4 (four) hours as needed for wheezing or shortness of breath (or coughing or chest tightness)., Disp: 1 Inhaler, Rfl: 0   predniSONE (DELTASONE) 50 MG tablet, Take 1 tablet (50 mg total) by mouth daily., Disp: 5 tablet, Rfl: 0 Medication Side Effects: none  Family Medical/ Social History: Changes? no   MENTAL HEALTH EXAM:  There were no vitals taken for this visit.There is no height or weight on file to calculate BMI.  General Appearance: Casual, Neat, and Well Groomed  Eye Contact:  Good  Speech:  Clear and  Coherent  Volume:  Normal  Mood:  Depressed  Affect:  Appropriate  Thought Process:   normal  Orientation:  Full (Time, Place, and Person)  Thought Content: Logical   Suicidal Thoughts:  No  Homicidal Thoughts:  No  Memory:  WNL  Judgement:  Good  Insight:  Good  Psychomotor Activity:  Normal  Concentration:  Concentration: Good  Recall:  Good  Fund of Knowledge: Good  Language: Good  Assets:  Desire for Improvement  ADL's:  Intact  Cognition: WNL  Prognosis:  Good    DIAGNOSES: No diagnosis found.  Receiving Psychotherapy: No    RECOMMENDATIONS:  Greater than 50% of 30 min  face to face time with patient was spent on counseling and coordination of care. We discussed recent improvement with anxiety and depression. Discussed past trauma in marriage and family dynamics. Discussed new coping skills and changes within family relationships.     Treatment Plan/Recommendations:  Continue Klonopin 0.5 mg daily Patient wants to continue with SAM-E for a few weeks to see if effective To start Wellbutrin 150 mg XL when experiencing seasonal depressive symptoms. Will notify this office when starting medication.  Will follow up in 3 months to reassess Provided emergency contact information  Discussed potential benefits, risk, and side effects of benzodiazepines to include potential risk of tolerance and dependence, as well as possible drowsiness.  Advised patient not to drive if experiencing drowsiness and to take lowest possible effective dose to minimize risk of dependence and tolerance.   Reviewed  PDMP   Joan Flores, NP

## 2021-08-14 ENCOUNTER — Other Ambulatory Visit: Payer: Self-pay | Admitting: Behavioral Health

## 2021-08-14 DIAGNOSIS — F4310 Post-traumatic stress disorder, unspecified: Secondary | ICD-10-CM

## 2021-08-14 DIAGNOSIS — F330 Major depressive disorder, recurrent, mild: Secondary | ICD-10-CM

## 2021-08-14 DIAGNOSIS — F411 Generalized anxiety disorder: Secondary | ICD-10-CM

## 2021-09-25 ENCOUNTER — Other Ambulatory Visit: Payer: Self-pay | Admitting: Behavioral Health

## 2021-09-25 DIAGNOSIS — F4310 Post-traumatic stress disorder, unspecified: Secondary | ICD-10-CM

## 2021-09-25 DIAGNOSIS — F411 Generalized anxiety disorder: Secondary | ICD-10-CM

## 2021-09-25 DIAGNOSIS — F330 Major depressive disorder, recurrent, mild: Secondary | ICD-10-CM

## 2021-10-11 ENCOUNTER — Encounter: Payer: Self-pay | Admitting: Behavioral Health

## 2021-10-11 ENCOUNTER — Ambulatory Visit (INDEPENDENT_AMBULATORY_CARE_PROVIDER_SITE_OTHER): Payer: BC Managed Care – PPO | Admitting: Behavioral Health

## 2021-10-11 DIAGNOSIS — F330 Major depressive disorder, recurrent, mild: Secondary | ICD-10-CM

## 2021-10-11 DIAGNOSIS — F4310 Post-traumatic stress disorder, unspecified: Secondary | ICD-10-CM

## 2021-10-11 DIAGNOSIS — F411 Generalized anxiety disorder: Secondary | ICD-10-CM | POA: Diagnosis not present

## 2021-10-11 MED ORDER — BUPROPION HCL ER (XL) 150 MG PO TB24: 150.0000 mg | ORAL_TABLET | Freq: Every day | ORAL | 1 refills | Status: DC

## 2021-10-11 NOTE — Progress Notes (Signed)
Rose West [id number] [birth year] 51 y.o.  Virtual Visit via Telephone Note  I connected with pt on 10/11/21 at 10:00 AM EST by telephone and verified that I am speaking with the correct person using two identifiers.   I discussed the limitations, risks, security and privacy concerns of performing an evaluation and management service by telephone and the availability of in person appointments. I also discussed with the patient that there may be a patient responsible charge related to this service. The patient expressed understanding and agreed to proceed.   I discussed the assessment and treatment plan with the patient. The patient was provided an opportunity to ask questions and all were answered. The patient agreed with the plan and demonstrated an understanding of the instructions.   The patient was advised to call back or seek an in-person evaluation if the symptoms worsen or if the condition fails to improve as anticipated.  I provided 30 minutes of non-face-to-face time during this encounter.  The patient was located at home.  The provider was located at [place].   Rose Brooklyn, NP   Subjective:   Patient ID:  Rose West is a 50 y.o. (DOB September 12, 1971) female.  Chief Complaint:  Chief Complaint  Patient presents with   Anxiety   Depression   Medication Refill   Patient Education   Follow-up    HPI  Rose West presents for follow-up and medication management. She says that she has been doing very well. She says that she is sorry for not notifying me but she did decide to start the Wellbutrin in November 2022. Says that she wished she had not had fear and started it earlier. She is feeling much better and depression and anxiety are now very low levels. She would like to continue medication for longer period instead of just seasonally. She reports her anxiety today at 2/10 and depression at 2/10. She is sleeping 7-8 hours per night. No mania, no  psychosis, no SI/HI.   Past psychiatric medications: Klonopin   Review of Systems:  Review of Systems  Medications: I have reviewed the patient's current medications.  Current Outpatient Medications  Medication Sig Dispense Refill   albuterol (PROVENTIL HFA;VENTOLIN HFA) 108 (90 Base) MCG/ACT inhaler Inhale 2 puffs into the lungs every 4 (four) hours as needed for wheezing or shortness of breath (or coughing or chest tightness). 1 Inhaler 0   buPROPion (WELLBUTRIN XL) 150 MG 24 hr tablet Take 1 tablet (150 mg total) by mouth daily. 90 tablet 1   clonazePAM (KLONOPIN) 0.5 MG tablet Take 1 tablet (0.5 mg total) by mouth daily. 30 tablet 2   predniSONE (DELTASONE) 50 MG tablet Take 1 tablet (50 mg total) by mouth daily. 5 tablet 0   No current facility-administered medications for this visit.    Medication Side Effects: None  Allergies: No Known Allergies  No past medical history on file.  No family history on file.  Social History   Socioeconomic History   Marital status: Married    Spouse name: Not on file   Number of children: 2   Years of education: Not on file   Highest education level: Not on file  Occupational History   Not on file  Tobacco Use   Smoking status: Former   Smokeless tobacco: Never  Vaping Use   Vaping Use: Never used  Substance and Sexual Activity   Alcohol use: Yes    Comment: occcasionally one drink or two  Drug use: No   Sexual activity: Not Currently  Other Topics Concern   Not on file  Social History Narrative   Lives in [place] with husband and one 47 year old daughter.     Social Determinants of Health   Financial Resource Strain: Not on file  Food Insecurity: Not on file  Transportation Needs: Not on file  Physical Activity: Not on file  Stress: Not on file  Social Connections: Not on file  Intimate Partner Violence: Not on file    Past Medical History, Surgical history, Social history, and Family history were reviewed and  updated as appropriate.   Please see review of systems for further details on the patient's review from today.   Objective:   Physical Exam:  There were no vitals taken for this visit.  Physical Exam  Lab Review:     Component Value Date/Time   NA 137 10/11/2015 2300   K 3.7 10/11/2015 2300   CL 106 10/11/2015 2300   CO2 23 10/11/2015 2300   GLUCOSE 113 (H) 10/11/2015 2300   BUN 14 10/11/2015 2300   CREATININE 0.96 10/11/2015 2300   CALCIUM 8.9 10/11/2015 2300   GFRNONAA >60 10/11/2015 2300   GFRAA >60 10/11/2015 2300       Component Value Date/Time   WBC 10.7 (H) 10/11/2015 2300   RBC 4.57 10/11/2015 2300   HGB 13.0 10/11/2015 2300   HCT 39.1 10/11/2015 2300   PLT 356 10/11/2015 2300   MCV 85.6 10/11/2015 2300   MCH 28.4 10/11/2015 2300   MCHC 33.2 10/11/2015 2300   RDW 13.2 10/11/2015 2300    No results found for: POCLITH, LITHIUM   No results found for: PHENYTOIN, PHENOBARB, VALPROATE, CBMZ   .res Assessment: Plan:    [name] was seen today for anxiety, depression, medication refill, patient education and follow-up.  Diagnoses and all orders for this visit:  Generalized anxiety disorder -     buPROPion (WELLBUTRIN XL) 150 MG 24 hr tablet; Take 1 tablet (150 mg total) by mouth daily.  PTSD (post-traumatic stress disorder) -     buPROPion (WELLBUTRIN XL) 150 MG 24 hr tablet; Take 1 tablet (150 mg total) by mouth daily.  Mild episode of recurrent major depressive disorder (HCC) -     buPROPion (WELLBUTRIN XL) 150 MG 24 hr tablet; Take 1 tablet (150 mg total) by mouth daily.    RECOMMENDATIONS:   Greater than 50% of 30 min  face to face time with patient was spent on counseling and coordination of care. We discussed recent improvement with anxiety and depression. Discussed new coping skills and changes within family relationships.    We discussed good sleep hygiene. We reviewed medication alternatives again. She had questions about dosing and weaning off  medication. Treatment Plan/Recommendations:  Continue Klonopin 0.5 mg daily Continue Wellbutrin 150 mg XL  Will follow up in 3 months to reassess Provided emergency contact information  Discussed potential benefits, risk, and side effects of benzodiazepines to include potential risk of tolerance and dependence, as well as possible drowsiness.  Advised patient not to drive if experiencing drowsiness and to take lowest possible effective dose to minimize risk of dependence and tolerance.    Reviewed PDMP     Rose Brooklyn, NP             Please see After Visit Summary for patient specific instructions.  No future appointments.  No orders of the defined types were placed in this encounter.   -------------------------------

## 2021-12-26 ENCOUNTER — Telehealth: Payer: Self-pay | Admitting: Behavioral Health

## 2021-12-26 ENCOUNTER — Other Ambulatory Visit: Payer: Self-pay | Admitting: Behavioral Health

## 2021-12-26 DIAGNOSIS — F411 Generalized anxiety disorder: Secondary | ICD-10-CM

## 2021-12-26 DIAGNOSIS — F330 Major depressive disorder, recurrent, mild: Secondary | ICD-10-CM

## 2021-12-26 DIAGNOSIS — F4310 Post-traumatic stress disorder, unspecified: Secondary | ICD-10-CM

## 2021-12-26 MED ORDER — BUPROPION HCL ER (XL) 150 MG PO TB24: 150.0000 mg | ORAL_TABLET | Freq: Every day | ORAL | 2 refills | Status: DC

## 2021-12-26 NOTE — Telephone Encounter (Signed)
RX sent To [facility] [address]

## 2021-12-26 NOTE — Telephone Encounter (Signed)
Rose West lvm requesting a 90 day supply of Wellbutrin. Patient was last seen 10/11/21 with no follow up scheduled. Contact info # [phone]. ?

## 2022-12-28 ENCOUNTER — Other Ambulatory Visit: Payer: Self-pay | Admitting: Behavioral Health

## 2022-12-28 DIAGNOSIS — F330 Major depressive disorder, recurrent, mild: Secondary | ICD-10-CM

## 2022-12-28 DIAGNOSIS — F411 Generalized anxiety disorder: Secondary | ICD-10-CM

## 2022-12-28 DIAGNOSIS — F4310 Post-traumatic stress disorder, unspecified: Secondary | ICD-10-CM

## 2022-12-31 ENCOUNTER — Telehealth: Payer: Self-pay | Admitting: Behavioral Health

## 2022-12-31 DIAGNOSIS — F330 Major depressive disorder, recurrent, mild: Secondary | ICD-10-CM

## 2022-12-31 DIAGNOSIS — F4310 Post-traumatic stress disorder, unspecified: Secondary | ICD-10-CM

## 2022-12-31 DIAGNOSIS — F411 Generalized anxiety disorder: Secondary | ICD-10-CM

## 2022-12-31 MED ORDER — BUPROPION HCL ER (XL) 150 MG PO TB24: 150.0000 mg | ORAL_TABLET | Freq: Every day | ORAL | 0 refills | Status: DC

## 2022-12-31 NOTE — Telephone Encounter (Signed)
Sent!

## 2022-12-31 NOTE — Telephone Encounter (Signed)
Rose West called at 1:40 to request refill of her bupropion. She made appt for 4/11, in person.  [id number] [place], [place]

## 2023-01-09 ENCOUNTER — Ambulatory Visit: Payer: BC Managed Care – PPO | Admitting: Behavioral Health

## 2023-01-09 ENCOUNTER — Encounter: Payer: Self-pay | Admitting: Behavioral Health

## 2023-01-09 DIAGNOSIS — F3342 Major depressive disorder, recurrent, in full remission: Secondary | ICD-10-CM | POA: Diagnosis not present

## 2023-01-09 DIAGNOSIS — F4310 Post-traumatic stress disorder, unspecified: Secondary | ICD-10-CM

## 2023-01-09 DIAGNOSIS — F411 Generalized anxiety disorder: Secondary | ICD-10-CM | POA: Diagnosis not present

## 2023-01-09 MED ORDER — BUPROPION HCL ER (XL) 150 MG PO TB24: 150.0000 mg | ORAL_TABLET | Freq: Every day | ORAL | 2 refills | Status: DC

## 2023-01-09 NOTE — Progress Notes (Signed)
[facility]  Patient ID: Rose West,  MRN: [id number]  PCP: Richardean Chimera, MD  Date of Evaluation: 01/09/2023 Time spent:30 minutes  Chief Complaint:  Chief Complaint   Anxiety; Depression; Medication Refill; Follow-up; Patient Education     HISTORY/CURRENT STATUS: HPI Rose West presents for follow-up and medication management. She says that she has been doing very well.  No changes this visit. To continue with medication. Requesting 1 year f/u.  She reports her anxiety today at 1/10 and depression at 1/10. She is sleeping 7-8 hours per night. No mania, no psychosis, no SI/HI.    Past psychiatric medications: Klonopin     Individual Medical History/ Review of Systems: Changes? :No   Allergies: Patient has no known allergies.  Current Medications:  Current Outpatient Medications:    albuterol (PROVENTIL HFA;VENTOLIN HFA) 108 (90 Base) MCG/ACT inhaler, Inhale 2 puffs into the lungs every 4 (four) hours as needed for wheezing or shortness of breath (or coughing or chest tightness)., Disp: 1 Inhaler, Rfl: 0   buPROPion (WELLBUTRIN XL) 150 MG 24 hr tablet, Take 1 tablet (150 mg total) by mouth daily., Disp: 90 tablet, Rfl: 2   clonazePAM (KLONOPIN) 0.5 MG tablet, Take 1 tablet (0.5 mg total) by mouth daily., Disp: 30 tablet, Rfl: 2   predniSONE (DELTASONE) 50 MG tablet, Take 1 tablet (50 mg total) by mouth daily., Disp: 5 tablet, Rfl: 0 Medication Side Effects: none  Family Medical/ Social History: Changes? No  MENTAL HEALTH EXAM:  There were no vitals taken for this visit.There is no height or weight on file to calculate BMI.  General Appearance: Casual, Neat, and Well Groomed  Eye Contact:  Good  Speech:  Clear and Coherent  Volume:  Normal  Mood:  NA  Affect:  Appropriate  Thought Process:  Coherent  Orientation:  Full (Time, Place, and Person)  Thought Content: Logical   Suicidal Thoughts:  No  Homicidal Thoughts:  No  Memory:  WNL   Judgement:  Good  Insight:  Good  Psychomotor Activity:  Normal  Concentration:  Concentration: Good  Recall:  Good  Fund of Knowledge: Good  Language: Good  Assets:  Desire for Improvement  ADL's:  Intact  Cognition: WNL  Prognosis:  Good    DIAGNOSES:    ICD-10-CM   1. Recurrent major depressive disorder, in full remission  F33.42     2. Generalized anxiety disorder  F41.1 buPROPion (WELLBUTRIN XL) 150 MG 24 hr tablet    3. PTSD (post-traumatic stress disorder)  F43.10 buPROPion (WELLBUTRIN XL) 150 MG 24 hr tablet      Receiving Psychotherapy: No    RECOMMENDATIONS:   Greater than 50% of 30 min  face to face time with patient was spent on counseling and coordination of care. We discussed her exceptional stability. Requesting no changes to medications.     Treatment Plan/Recommendations:  Continue Klonopin 0.5 mg daily Continue Wellbutrin 150 mg XL  Will follow up in 1 year to reassess Provided emergency contact information  Discussed potential benefits, risk, and side effects of benzodiazepines to include potential risk of tolerance and dependence, as well as possible drowsiness.  Advised patient not to drive if experiencing drowsiness and to take lowest possible effective dose to minimize risk of dependence and tolerance.    Reviewed PDMP      Joan Flores, NP

## 2023-10-17 ENCOUNTER — Other Ambulatory Visit: Payer: Self-pay | Admitting: Behavioral Health

## 2023-10-17 DIAGNOSIS — F411 Generalized anxiety disorder: Secondary | ICD-10-CM

## 2023-10-17 DIAGNOSIS — F4310 Post-traumatic stress disorder, unspecified: Secondary | ICD-10-CM

## 2023-12-05 ENCOUNTER — Other Ambulatory Visit: Payer: Self-pay | Admitting: Family Medicine

## 2023-12-05 DIAGNOSIS — Z720 Tobacco use: Secondary | ICD-10-CM

## 2023-12-05 DIAGNOSIS — E7800 Pure hypercholesterolemia, unspecified: Secondary | ICD-10-CM

## 2023-12-05 DIAGNOSIS — R030 Elevated blood-pressure reading, without diagnosis of hypertension: Secondary | ICD-10-CM

## 2023-12-05 DIAGNOSIS — R079 Chest pain, unspecified: Secondary | ICD-10-CM

## 2023-12-10 ENCOUNTER — Other Ambulatory Visit

## 2023-12-10 ENCOUNTER — Ambulatory Visit
Admission: RE | Admit: 2023-12-10 | Discharge: 2023-12-10 | Discharge status: Home / Self Care | Source: Ambulatory Visit | Attending: Family Medicine

## 2023-12-10 DIAGNOSIS — R079 Chest pain, unspecified: Secondary | ICD-10-CM

## 2023-12-10 DIAGNOSIS — R030 Elevated blood-pressure reading, without diagnosis of hypertension: Secondary | ICD-10-CM

## 2023-12-10 DIAGNOSIS — E7800 Pure hypercholesterolemia, unspecified: Secondary | ICD-10-CM

## 2023-12-10 DIAGNOSIS — Z720 Tobacco use: Secondary | ICD-10-CM

## 2024-01-08 ENCOUNTER — Ambulatory Visit: Payer: BC Managed Care – PPO | Admitting: Behavioral Health

## 2024-01-09 ENCOUNTER — Ambulatory Visit: Admitting: Behavioral Health

## 2024-01-09 ENCOUNTER — Encounter: Payer: Self-pay | Admitting: Behavioral Health

## 2024-01-09 ENCOUNTER — Ambulatory Visit: Payer: Self-pay | Admitting: Behavioral Health

## 2024-01-09 DIAGNOSIS — F3342 Major depressive disorder, recurrent, in full remission: Secondary | ICD-10-CM

## 2024-01-09 DIAGNOSIS — F4310 Post-traumatic stress disorder, unspecified: Secondary | ICD-10-CM

## 2024-01-09 DIAGNOSIS — F32A Depression, unspecified: Secondary | ICD-10-CM

## 2024-01-09 DIAGNOSIS — F419 Anxiety disorder, unspecified: Secondary | ICD-10-CM

## 2024-01-09 DIAGNOSIS — F411 Generalized anxiety disorder: Secondary | ICD-10-CM

## 2024-01-09 MED ORDER — BUPROPION HCL ER (XL) 150 MG PO TB24: 150.0000 mg | ORAL_TABLET | Freq: Every day | ORAL | 0 refills | Status: DC

## 2024-01-09 NOTE — Progress Notes (Signed)
 Rose West [id number] 02-01-1971 53 y.o.  Virtual Visit via Telephone Note  I connected with pt on 01/09/24 at  9:30 AM EDT by telephone and verified that I am speaking with the correct person using two identifiers.   I discussed the limitations, risks, security and privacy concerns of performing an evaluation and management service by telephone and the availability of in person appointments. I also discussed with the patient that there may be a patient responsible charge related to this service. The patient expressed understanding and agreed to proceed.   I discussed the assessment and treatment plan with the patient. The patient was provided an opportunity to ask questions and all were answered. The patient agreed with the plan and demonstrated an understanding of the instructions.   The patient was advised to call back or seek an in-person evaluation if the symptoms worsen or if the condition fails to improve as anticipated.  I provided 20 minutes of non-face-to-face time during this encounter.  The patient was located at home.  The provider was located at [facility].   Joan Flores, NP   Subjective:   Patient ID:  Rose West is a 53 y.o. (DOB [birth year]) female.  Chief Complaint: No chief complaint on file.   HPI LYDIAH PONG presents for follow-up and medication management. She says that she has been doing very well.  No changes this visit. To continue with medication. Requesting 1 year f/u.  She reports her anxiety today at 1/10 and depression at 1/10. She is sleeping 7-8 hours per night. No mania, no psychosis, no SI/HI.    Past psychiatric medications: Klonopin      Review of Systems:  Review of Systems  Constitutional: Negative.   Allergic/Immunologic: Negative.   Neurological: Negative.   Psychiatric/Behavioral: Negative.      Medications: I have reviewed the patient's current medications.  Current Outpatient Medications  Medication Sig  Dispense Refill  . albuterol (PROVENTIL HFA;VENTOLIN HFA) 108 (90 Base) MCG/ACT inhaler Inhale 2 puffs into the lungs every 4 (four) hours as needed for wheezing or shortness of breath (or coughing or chest tightness). 1 Inhaler 0  . buPROPion (WELLBUTRIN XL) 150 MG 24 hr tablet TAKE 1 TABLET BY MOUTH EVERY DAY 90 tablet 0  . clonazePAM (KLONOPIN) 0.5 MG tablet Take 1 tablet (0.5 mg total) by mouth daily. 30 tablet 2  . predniSONE (DELTASONE) 50 MG tablet Take 1 tablet (50 mg total) by mouth daily. 5 tablet 0   No current facility-administered medications for this visit.    Medication Side Effects: None  Allergies: No Known Allergies  No past medical history on file.  No family history on file.  Social History   Socioeconomic History  . Marital status: Married    Spouse name: Not on file  . Number of children: 2  . Years of education: Not on file  . Highest education level: Not on file  Occupational History  . Not on file  Tobacco Use  . Smoking status: Former  . Smokeless tobacco: Never  Vaping Use  . Vaping status: Never Used  Substance and Sexual Activity  . Alcohol use: Yes    Comment: occcasionally one drink or two  . Drug use: No  . Sexual activity: Not Currently  Other Topics Concern  . Not on file  Social History Narrative   Lives in [place] with husband and one 74 year old daughter.     Social Drivers of Dispensing optician  Resource Strain: Not on file  Food Insecurity: Not on file  Transportation Needs: Not on file  Physical Activity: Not on file  Stress: Not on file  Social Connections: Not on file  Intimate Partner Violence: Not At Risk (12/27/2020)   Received from [facility]   Humiliation, Afraid, Rape, and Kick questionnaire   . Fear of Current or Ex-Partner: No   . Emotionally Abused: No   . Physically Abused: No   . Sexually Abused: No    Past Medical History, Surgical history, Social history, and Family history were reviewed  and updated as appropriate.   Please see review of systems for further details on the patient's review from today.   Objective:   Physical Exam:  There were no vitals taken for this visit.  Physical Exam Neurological:     Mental Status: She is alert and oriented to person, place, and time.  Psychiatric:        Attention and Perception: Attention and perception normal.        Mood and Affect: Mood normal.        Speech: Speech normal.        Behavior: Behavior normal. Behavior is cooperative.        Cognition and Memory: Cognition and memory normal.        Judgment: Judgment normal.     Comments: Insight intact    Lab Review:     Component Value Date/Time   NA 137 10/11/2015 2300   K 3.7 10/11/2015 2300   CL 106 10/11/2015 2300   CO2 23 10/11/2015 2300   GLUCOSE 113 (H) 10/11/2015 2300   BUN 14 10/11/2015 2300   CREATININE 0.96 10/11/2015 2300   CALCIUM 8.9 10/11/2015 2300   GFRNONAA >60 10/11/2015 2300   GFRAA >60 10/11/2015 2300       Component Value Date/Time   WBC 10.7 (H) 10/11/2015 2300   RBC 4.57 10/11/2015 2300   HGB 13.0 10/11/2015 2300   HCT 39.1 10/11/2015 2300   PLT 356 10/11/2015 2300   MCV 85.6 10/11/2015 2300   MCH 28.4 10/11/2015 2300   MCHC 33.2 10/11/2015 2300   RDW 13.2 10/11/2015 2300    No results found for: "POCLITH", "LITHIUM"   No results found for: "PHENYTOIN", "PHENOBARB", "VALPROATE", "CBMZ"   .res Assessment: Plan:    Greater than 50% of 30 min  face to face time with patient was spent on counseling and coordination of care. We discussed her exceptional stability. Requesting no changes to medications.     Treatment Plan/Recommendations:  Continue Klonopin 0.5 mg daily Continue Wellbutrin 150 mg XL  Will follow up in 1 year to reassess Provided emergency contact information  Discussed potential benefits, risk, and side effects of benzodiazepines to include potential risk of tolerance and dependence, as well as possible  drowsiness.  Advised patient not to drive if experiencing drowsiness and to take lowest possible effective dose to minimize risk of dependence and tolerance.    Reviewed PDMP    There are no diagnoses linked to this encounter.  Please see After Visit Summary for patient specific instructions.  No future appointments.  No orders of the defined types were placed in this encounter.     -------------------------------

## 2024-04-11 ENCOUNTER — Other Ambulatory Visit: Payer: Self-pay | Admitting: Behavioral Health

## 2024-04-11 DIAGNOSIS — F411 Generalized anxiety disorder: Secondary | ICD-10-CM

## 2024-04-11 DIAGNOSIS — F4310 Post-traumatic stress disorder, unspecified: Secondary | ICD-10-CM
